# Patient Record
Sex: Female | Born: 1960 | Race: White | Hispanic: No | Marital: Single | Smoking: Never smoker
Health system: Southern US, Community
[De-identification: ages and names within clinical notes are randomized; demographics above are authoritative.]

## PROBLEM LIST (undated history)

## (undated) DIAGNOSIS — M7500 Adhesive capsulitis of unspecified shoulder: Secondary | ICD-10-CM

## (undated) DIAGNOSIS — I10 Essential (primary) hypertension: Secondary | ICD-10-CM

## (undated) DIAGNOSIS — K635 Polyp of colon: Secondary | ICD-10-CM

## (undated) DIAGNOSIS — K56609 Unspecified intestinal obstruction, unspecified as to partial versus complete obstruction: Secondary | ICD-10-CM

## (undated) HISTORY — DX: Polyp of colon: K63.5

## (undated) HISTORY — DX: Essential (primary) hypertension: I10

## (undated) HISTORY — DX: Unspecified intestinal obstruction, unspecified as to partial versus complete obstruction: K56.609

## (undated) HISTORY — DX: Adhesive capsulitis of unspecified shoulder: M75.00

---

## 1987-11-16 HISTORY — PX: TONSILLECTOMY AND ADENOIDECTOMY: SUR1326

## 1999-02-16 ENCOUNTER — Other Ambulatory Visit: Admission: RE | Admit: 1999-02-16 | Discharge: 1999-02-16 | Payer: Self-pay | Admitting: Obstetrics and Gynecology

## 2000-03-18 ENCOUNTER — Other Ambulatory Visit: Admission: RE | Admit: 2000-03-18 | Discharge: 2000-03-18 | Payer: Self-pay | Admitting: Obstetrics and Gynecology

## 2001-03-21 ENCOUNTER — Other Ambulatory Visit: Admission: RE | Admit: 2001-03-21 | Discharge: 2001-03-21 | Payer: Self-pay | Admitting: Obstetrics and Gynecology

## 2002-05-10 ENCOUNTER — Other Ambulatory Visit: Admission: RE | Admit: 2002-05-10 | Discharge: 2002-05-10 | Payer: Self-pay | Admitting: Obstetrics and Gynecology

## 2003-08-08 ENCOUNTER — Other Ambulatory Visit: Admission: RE | Admit: 2003-08-08 | Discharge: 2003-08-08 | Payer: Self-pay | Admitting: Obstetrics and Gynecology

## 2003-11-11 ENCOUNTER — Emergency Department (HOSPITAL_COMMUNITY): Admission: AD | Admit: 2003-11-11 | Discharge: 2003-11-11 | Payer: Self-pay | Admitting: Family Medicine

## 2003-11-20 ENCOUNTER — Emergency Department (HOSPITAL_COMMUNITY): Admission: AD | Admit: 2003-11-20 | Discharge: 2003-11-20 | Payer: Self-pay | Admitting: Family Medicine

## 2005-06-30 ENCOUNTER — Other Ambulatory Visit: Admission: RE | Admit: 2005-06-30 | Discharge: 2005-06-30 | Payer: Self-pay | Admitting: Obstetrics and Gynecology

## 2006-09-07 ENCOUNTER — Other Ambulatory Visit: Admission: RE | Admit: 2006-09-07 | Discharge: 2006-09-07 | Payer: Self-pay | Admitting: Obstetrics and Gynecology

## 2008-11-29 DIAGNOSIS — R1084 Generalized abdominal pain: Secondary | ICD-10-CM | POA: Insufficient documentation

## 2008-12-16 HISTORY — PX: COLECTOMY: SHX59

## 2008-12-26 ENCOUNTER — Emergency Department (HOSPITAL_COMMUNITY): Admission: EM | Admit: 2008-12-26 | Discharge: 2008-12-26 | Payer: Self-pay | Admitting: Family Medicine

## 2008-12-27 ENCOUNTER — Ambulatory Visit: Payer: Self-pay | Admitting: Family Medicine

## 2008-12-27 ENCOUNTER — Encounter: Payer: Self-pay | Admitting: Family Medicine

## 2008-12-27 ENCOUNTER — Inpatient Hospital Stay (HOSPITAL_COMMUNITY): Admission: EM | Admit: 2008-12-27 | Discharge: 2009-01-09 | Payer: Self-pay | Admitting: Emergency Medicine

## 2008-12-27 ENCOUNTER — Other Ambulatory Visit: Admission: RE | Admit: 2008-12-27 | Discharge: 2008-12-27 | Payer: Self-pay | Admitting: Family Medicine

## 2008-12-30 ENCOUNTER — Telehealth: Payer: Self-pay | Admitting: Family Medicine

## 2009-01-01 ENCOUNTER — Encounter (INDEPENDENT_AMBULATORY_CARE_PROVIDER_SITE_OTHER): Payer: Self-pay | Admitting: Surgery

## 2009-01-27 ENCOUNTER — Ambulatory Visit: Payer: Self-pay | Admitting: Family Medicine

## 2009-01-27 DIAGNOSIS — E1140 Type 2 diabetes mellitus with diabetic neuropathy, unspecified: Secondary | ICD-10-CM | POA: Insufficient documentation

## 2009-01-27 LAB — CONVERTED CEMR LAB
Albumin: 4 g/dL (ref 3.5–5.2)
Alkaline Phosphatase: 70 units/L (ref 39–117)
BUN: 19 mg/dL (ref 6–23)
Bilirubin, Direct: 0.1 mg/dL (ref 0.0–0.3)
Blood Glucose, Fingerstick: 159
Blood in Urine, dipstick: NEGATIVE
Calcium: 9.5 mg/dL (ref 8.4–10.5)
Cholesterol: 236 mg/dL (ref 0–200)
Creatinine, Ser: 0.6 mg/dL (ref 0.4–1.2)
Creatinine,U: 172.4 mg/dL
Hemoglobin: 14.3 g/dL (ref 12.0–15.0)
Lymphocytes Relative: 28 % (ref 12.0–46.0)
MCHC: 34.5 g/dL (ref 30.0–36.0)
MCV: 81.9 fL (ref 78.0–100.0)
Microalb, Ur: 0.7 mg/dL (ref 0.0–1.9)
Monocytes Relative: 6.4 % (ref 3.0–12.0)
Neutro Abs: 6.3 10*3/uL (ref 1.4–7.7)
Neutrophils Relative %: 63.4 % (ref 43.0–77.0)
Potassium: 3.7 meq/L (ref 3.5–5.1)
RBC: 5.07 M/uL (ref 3.87–5.11)
Total Bilirubin: 0.6 mg/dL (ref 0.3–1.2)
Total CHOL/HDL Ratio: 5.8
Total Protein: 7.2 g/dL (ref 6.0–8.3)
Triglycerides: 184 mg/dL — ABNORMAL HIGH (ref 0–149)
Urobilinogen, UA: 0.2
VLDL: 37 mg/dL (ref 0–40)

## 2009-09-15 ENCOUNTER — Encounter: Admission: RE | Admit: 2009-09-15 | Discharge: 2009-09-15 | Payer: Self-pay | Admitting: Obstetrics and Gynecology

## 2009-09-30 ENCOUNTER — Inpatient Hospital Stay (HOSPITAL_COMMUNITY): Admission: RE | Admit: 2009-09-30 | Discharge: 2009-10-04 | Payer: Self-pay | Admitting: Surgery

## 2009-09-30 HISTORY — PX: VENTRAL HERNIA REPAIR: SHX424

## 2009-10-10 ENCOUNTER — Inpatient Hospital Stay (HOSPITAL_COMMUNITY): Admission: EM | Admit: 2009-10-10 | Discharge: 2009-10-16 | Payer: Self-pay | Admitting: Emergency Medicine

## 2009-11-20 ENCOUNTER — Encounter: Admission: RE | Admit: 2009-11-20 | Discharge: 2009-11-20 | Payer: Self-pay | Admitting: Gastroenterology

## 2009-12-29 ENCOUNTER — Ambulatory Visit (HOSPITAL_COMMUNITY): Admission: RE | Admit: 2009-12-29 | Discharge: 2009-12-29 | Payer: Self-pay | Admitting: Gastroenterology

## 2010-02-13 IMAGING — CR DG ABDOMEN 2V
3 series · 3 of 3 positions shown · non-contrast
Comparison: 12/26/2008

CLINICAL DATA: Abdominal pain - colonic obstruction.

ABDOMEN - 2 VIEW

[w abdomen upright]
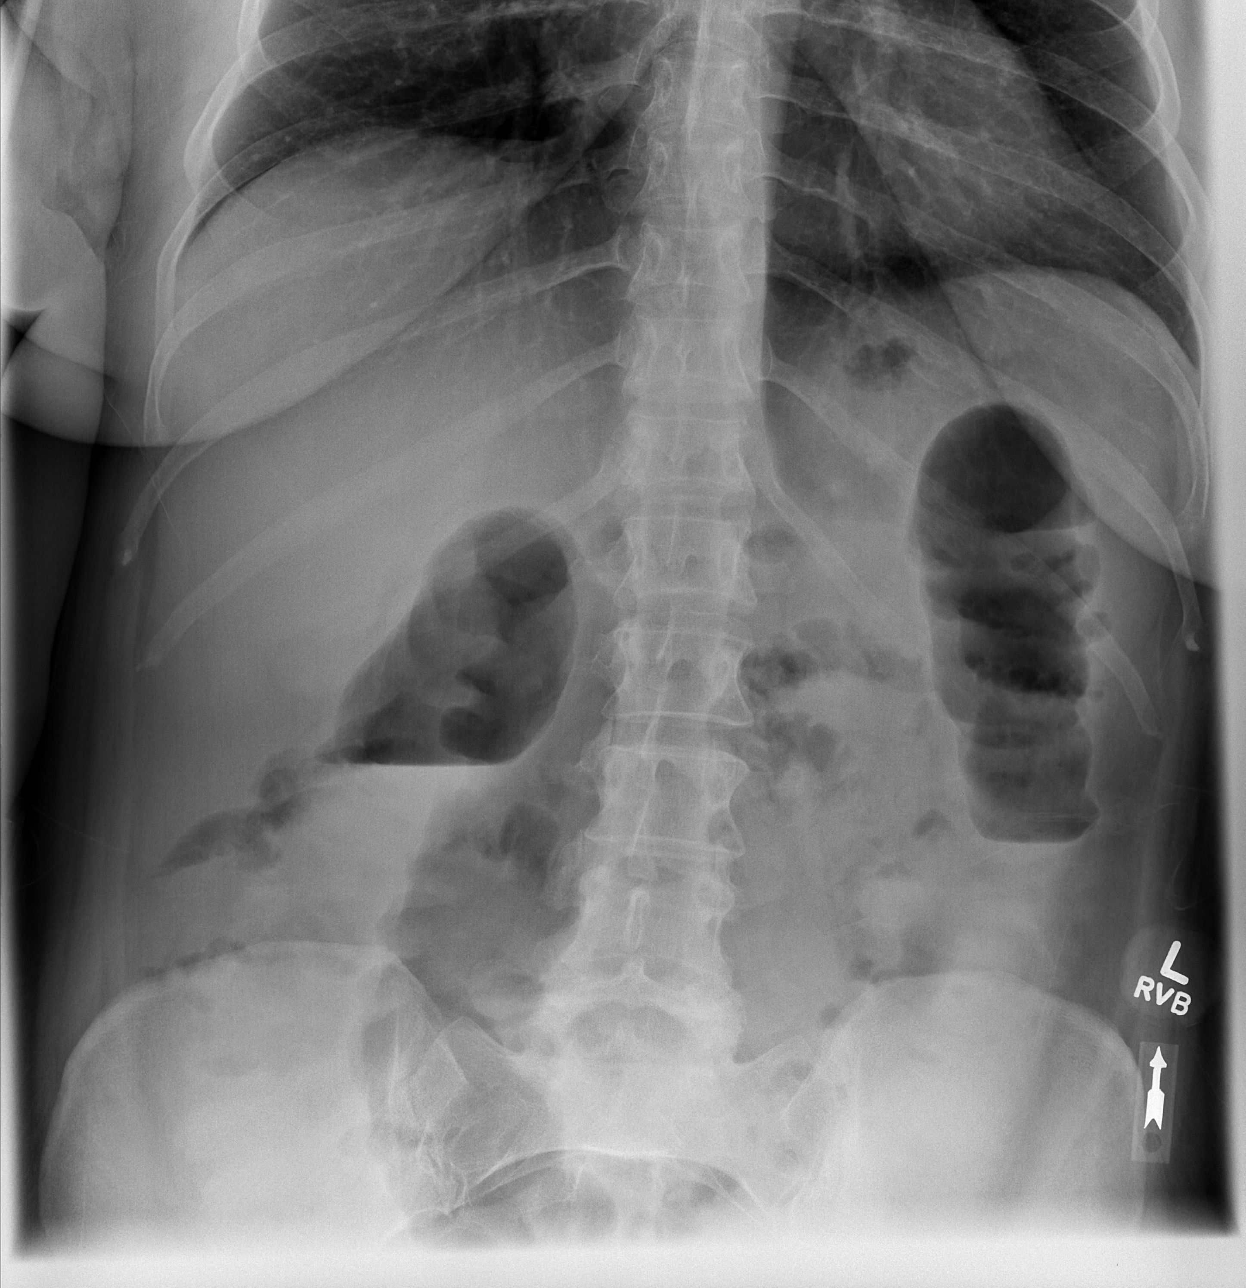

[t abdomen supine (1 of 2)]
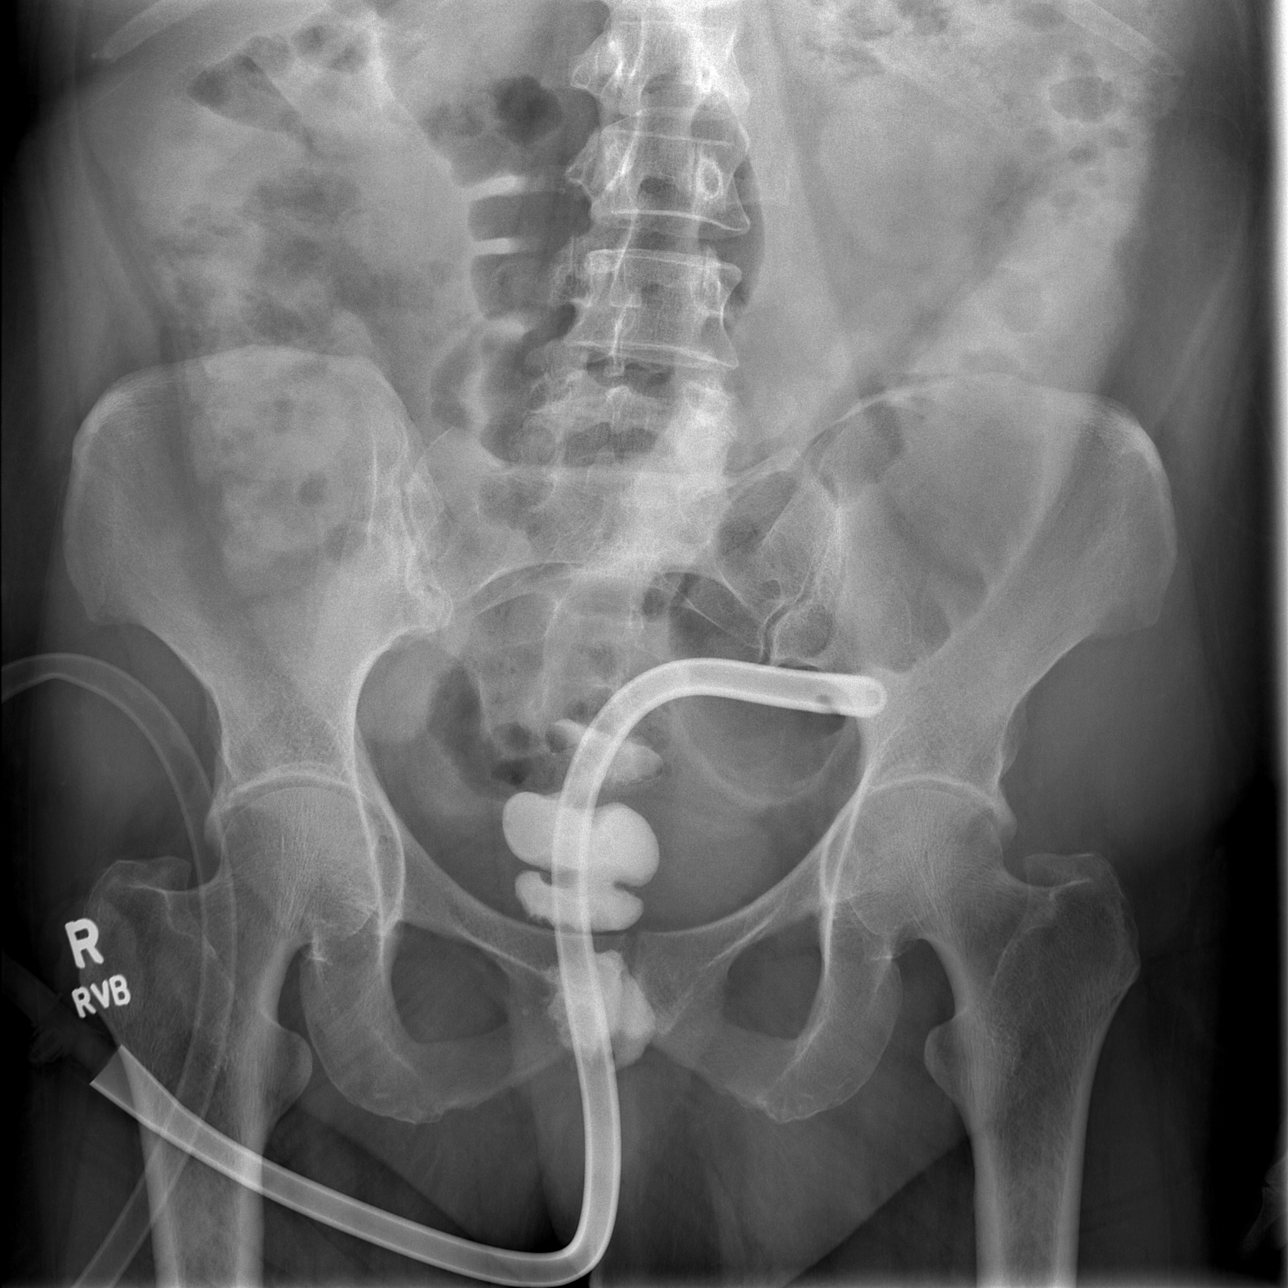

[t abdomen supine (2 of 2)]
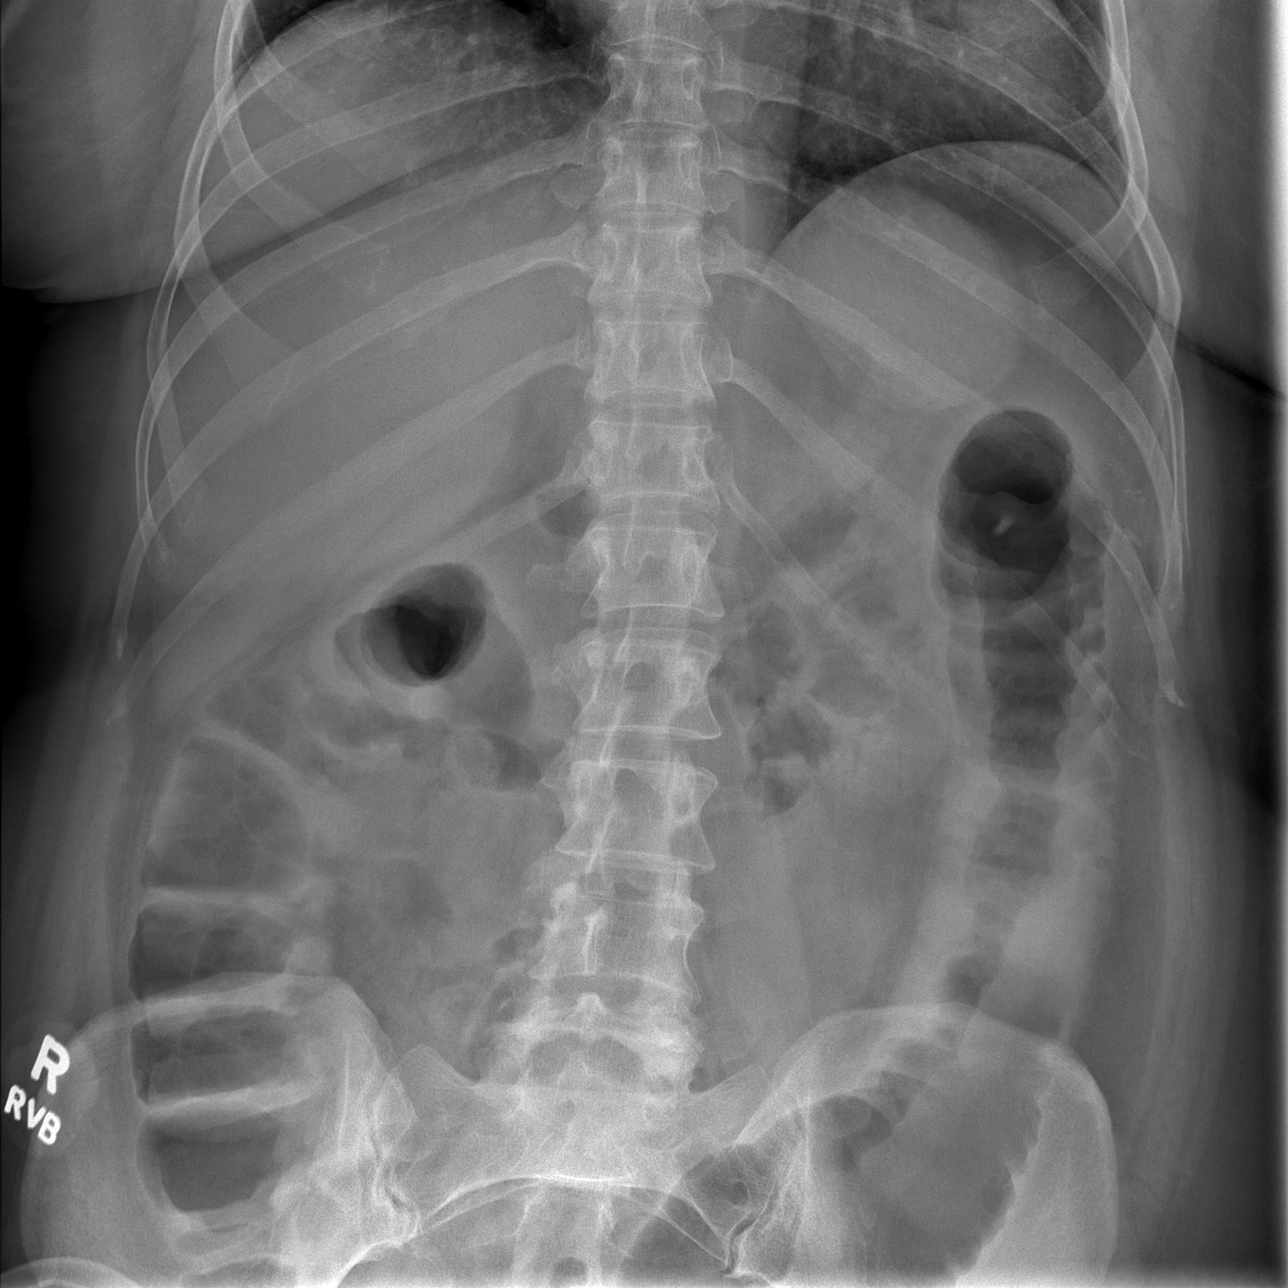

[3 of 3 positions shown; findings below may reference images not displayed]

FINDINGS: Interval placement of a rectal tube with decreased
colonic distention is noted.
Nondistended small bowel loops are present.
There is no evidence of pneumothorax.
No acute bony abnormalities are identified.
IMPRESSION: Interval placement of a rectal tube with decompression of the
colon.

## 2010-02-13 IMAGING — RF DG C-ARM 61-120 MIN
2 series · 2 of 2 positions shown · non-contrast
Comparison: none

CLINICAL DATA: ABDOMINAL PAIN 

C-ARM 1-60 MINUTES 
Fluoroscopy was utilized by the requesting physician.  No radiographic 
interpretation.

[Series 1: cont. · 1 of 1 slices shown (1 of 2)]
[im 1/1]
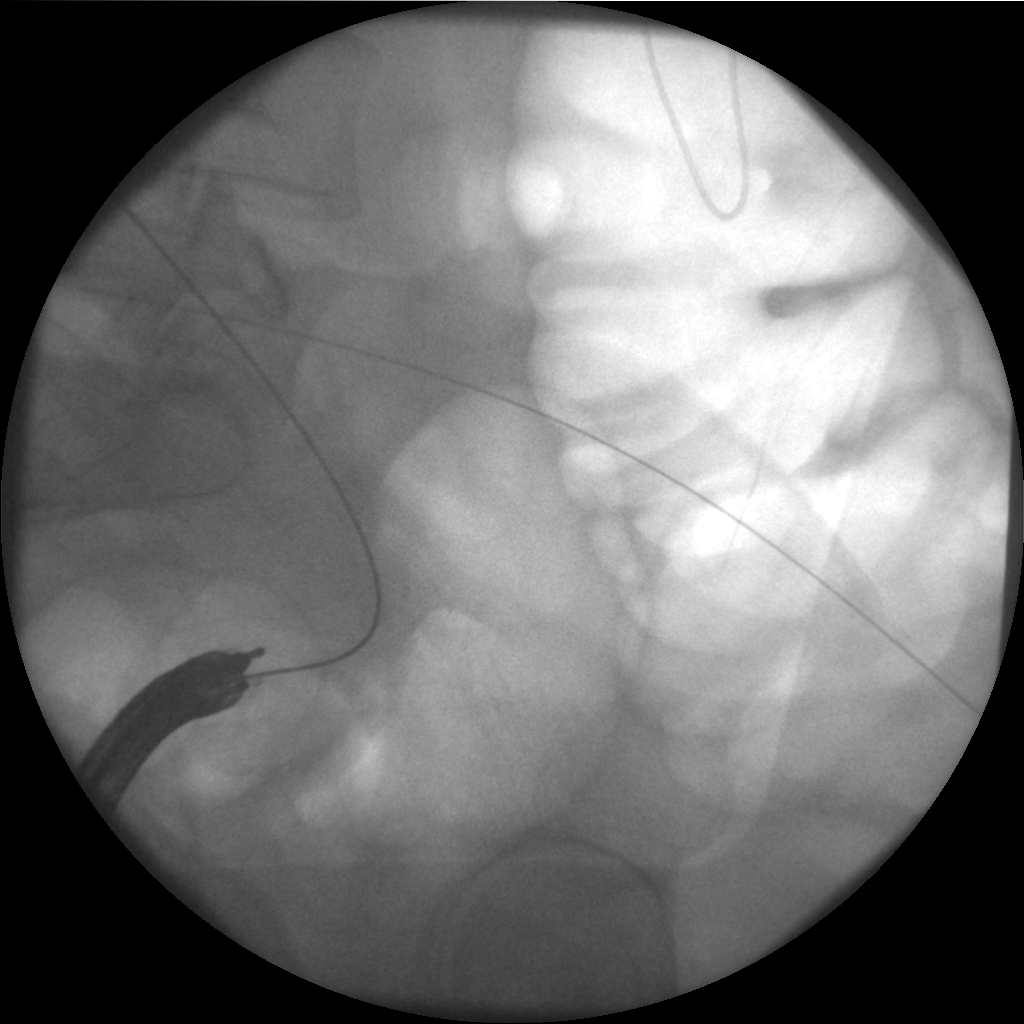

[Series 2: cont. · 1 of 1 slices shown (2 of 2)]
[im 1/1]
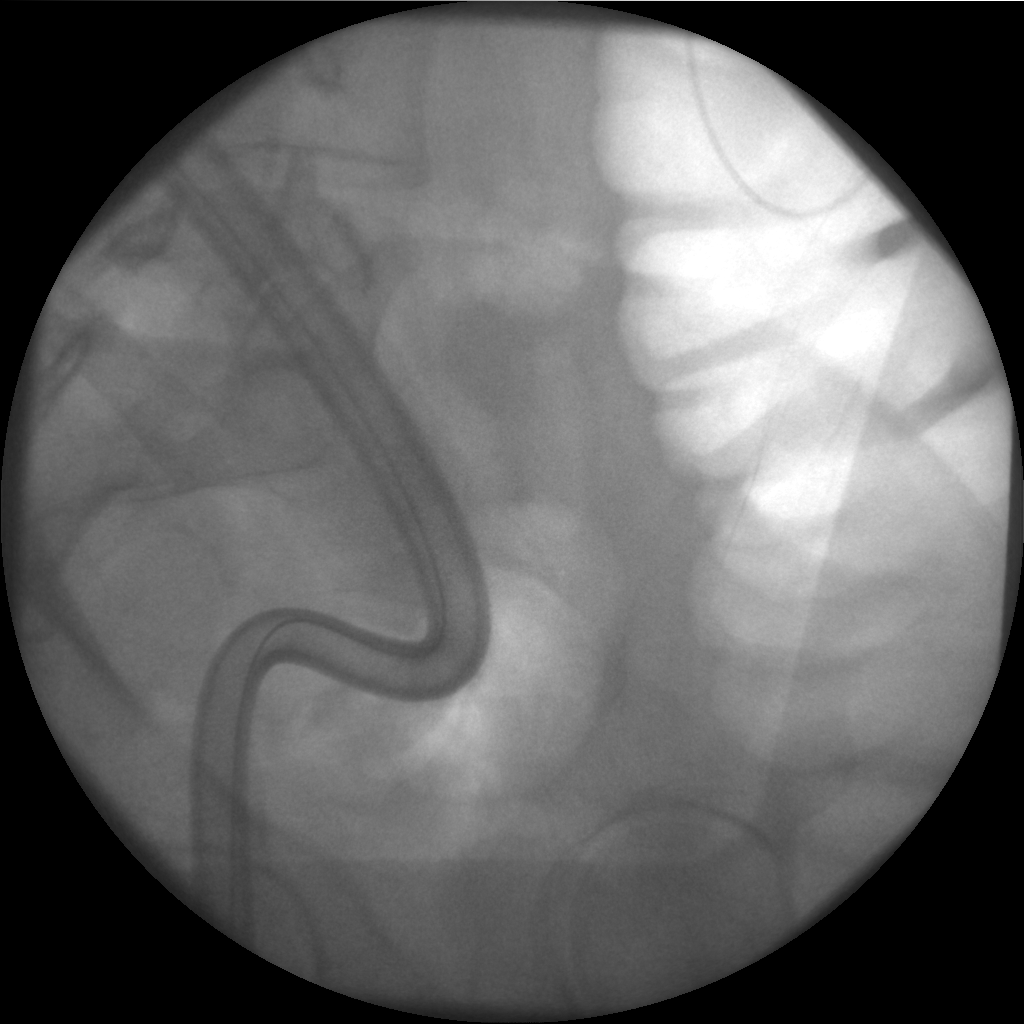

[2 of 2 positions shown; findings below may reference images not displayed]

## 2010-02-14 IMAGING — CR DG CHEST 1V PORT
1 series · 1 of 1 positions shown · non-contrast
Comparison: 12/28/2008

CLINICAL DATA: Abdominal pain.  PICC placement.

PORTABLE CHEST - 1 VIEW

[view not recorded]
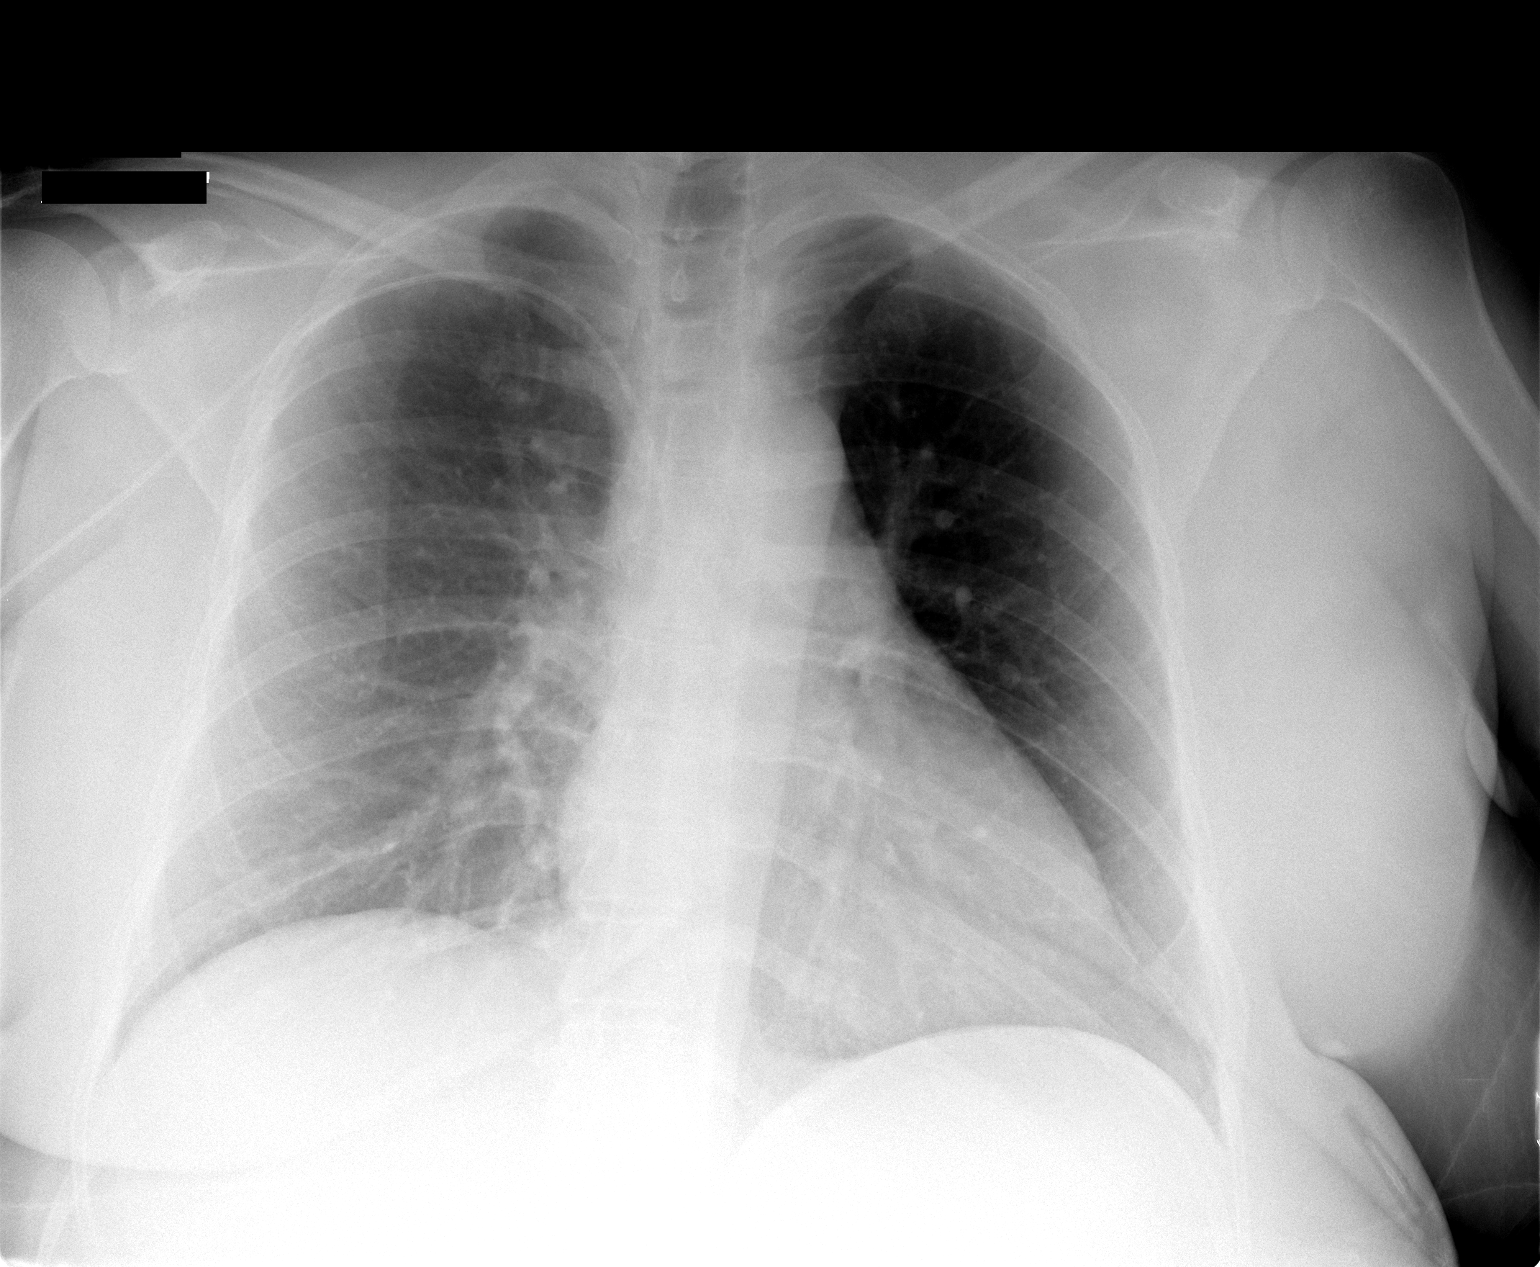

[1 of 1 positions shown; findings below may reference images not displayed]

FINDINGS: Right upper extremity PICC has been placed.  The tip is
difficult to visualize but appears in the upper SVC.
Cardiopericardial silhouette within normal limits.  Atelectasis is
present at both lung bases.  No airspace disease or effusion
identified.
IMPRESSION: Interval placement right upper extremity PICC.  There appears at
least in the superior SVC.

## 2010-11-15 HISTORY — PX: CLAVICLE EXCISION: SHX597

## 2011-02-16 LAB — GLUCOSE, CAPILLARY
Glucose-Capillary: 119 mg/dL — ABNORMAL HIGH (ref 70–99)
Glucose-Capillary: 143 mg/dL — ABNORMAL HIGH (ref 70–99)

## 2011-02-17 LAB — DIFFERENTIAL
Basophils Absolute: 0 10*3/uL (ref 0.0–0.1)
Lymphocytes Relative: 15 % (ref 12–46)
Lymphocytes Relative: 29 % (ref 12–46)
Lymphs Abs: 1.3 10*3/uL (ref 0.7–4.0)
Lymphs Abs: 3.1 10*3/uL (ref 0.7–4.0)
Monocytes Relative: 6 % (ref 3–12)
Neutro Abs: 6.7 10*3/uL (ref 1.7–7.7)
Neutro Abs: 6.8 10*3/uL (ref 1.7–7.7)
Neutrophils Relative %: 80 % — ABNORMAL HIGH (ref 43–77)
Neutrophils Relative %: 64 % (ref 43–77)

## 2011-02-17 LAB — GLUCOSE, CAPILLARY
Glucose-Capillary: 115 mg/dL — ABNORMAL HIGH (ref 70–99)
Glucose-Capillary: 126 mg/dL — ABNORMAL HIGH (ref 70–99)
Glucose-Capillary: 131 mg/dL — ABNORMAL HIGH (ref 70–99)
Glucose-Capillary: 133 mg/dL — ABNORMAL HIGH (ref 70–99)
Glucose-Capillary: 140 mg/dL — ABNORMAL HIGH (ref 70–99)
Glucose-Capillary: 141 mg/dL — ABNORMAL HIGH (ref 70–99)
Glucose-Capillary: 141 mg/dL — ABNORMAL HIGH (ref 70–99)
Glucose-Capillary: 143 mg/dL — ABNORMAL HIGH (ref 70–99)
Glucose-Capillary: 146 mg/dL — ABNORMAL HIGH (ref 70–99)
Glucose-Capillary: 151 mg/dL — ABNORMAL HIGH (ref 70–99)
Glucose-Capillary: 162 mg/dL — ABNORMAL HIGH (ref 70–99)
Glucose-Capillary: 165 mg/dL — ABNORMAL HIGH (ref 70–99)
Glucose-Capillary: 165 mg/dL — ABNORMAL HIGH (ref 70–99)
Glucose-Capillary: 172 mg/dL — ABNORMAL HIGH (ref 70–99)
Glucose-Capillary: 174 mg/dL — ABNORMAL HIGH (ref 70–99)
Glucose-Capillary: 177 mg/dL — ABNORMAL HIGH (ref 70–99)
Glucose-Capillary: 180 mg/dL — ABNORMAL HIGH (ref 70–99)
Glucose-Capillary: 185 mg/dL — ABNORMAL HIGH (ref 70–99)
Glucose-Capillary: 202 mg/dL — ABNORMAL HIGH (ref 70–99)

## 2011-02-17 LAB — COMPREHENSIVE METABOLIC PANEL
ALT: 16 U/L (ref 0–35)
AST: 13 U/L (ref 0–37)
BUN: 22 mg/dL (ref 6–23)
BUN: 15 mg/dL (ref 6–23)
CO2: 29 mEq/L (ref 19–32)
Calcium: 9.5 mg/dL (ref 8.4–10.5)
Calcium: 9 mg/dL (ref 8.4–10.5)
Chloride: 100 mEq/L (ref 96–112)
Creatinine, Ser: 0.84 mg/dL (ref 0.4–1.2)
GFR calc Af Amer: >60 mL/min (ref >60)
GFR calc non Af Amer: >60 mL/min (ref >60)
Glucose, Bld: 161 mg/dL — ABNORMAL HIGH (ref 70–99)
Sodium: 138 mEq/L (ref 135–145)
Total Bilirubin: 0.7 mg/dL (ref 0.3–1.2)
Total Protein: 7.3 g/dL (ref 6.0–8.3)

## 2011-02-17 LAB — CBC
HCT: 35.7 % — ABNORMAL LOW (ref 36.0–46.0)
HCT: 43.6 % (ref 36.0–46.0)
Hemoglobin: 12.1 g/dL (ref 12.0–15.0)
Hemoglobin: 13.3 g/dL (ref 12.0–15.0)
MCHC: 33.1 g/dL (ref 30.0–36.0)
MCHC: 34 g/dL (ref 30.0–36.0)
MCHC: 33.7 g/dL (ref 30.0–36.0)
MCV: 83.5 fL (ref 78.0–100.0)
Platelets: 438 10*3/uL — ABNORMAL HIGH (ref 150–400)
Platelets: 486 10*3/uL — ABNORMAL HIGH (ref 150–400)
RBC: 4.43 MIL/uL (ref 3.87–5.11)
RBC: 5.22 MIL/uL — ABNORMAL HIGH (ref 3.87–5.11)
RDW: 13.4 % (ref 11.5–15.5)
RDW: 13.8 % (ref 11.5–15.5)
WBC: 10.1 10*3/uL (ref 4.0–10.5)
WBC: 8.4 10*3/uL (ref 4.0–10.5)

## 2011-02-17 LAB — URINALYSIS, ROUTINE W REFLEX MICROSCOPIC
Bilirubin Urine: NEGATIVE
Leukocytes, UA: NEGATIVE
Nitrite: NEGATIVE
Specific Gravity, Urine: 1.028 (ref 1.005–1.030)
Urobilinogen, UA: 0.2 mg/dL (ref 0.0–1.0)
pH: 6.5 (ref 5.0–8.0)

## 2011-02-17 LAB — BASIC METABOLIC PANEL
BUN: 9 mg/dL (ref 6–23)
CO2: 24 mEq/L (ref 19–32)
Calcium: 8.3 mg/dL — ABNORMAL LOW (ref 8.4–10.5)
Creatinine, Ser: 0.64 mg/dL (ref 0.4–1.2)
GFR calc Af Amer: >60 mL/min (ref >60)
GFR calc Af Amer: >60 mL/min (ref >60)
GFR calc non Af Amer: >60 mL/min (ref >60)
GFR calc non Af Amer: >60 mL/min (ref >60)
Potassium: 4.2 mEq/L (ref 3.5–5.1)
Sodium: 139 mEq/L (ref 135–145)

## 2011-02-17 LAB — URINE MICROSCOPIC-ADD ON

## 2011-02-17 LAB — LIPASE, BLOOD: Lipase: 14 U/L (ref 11–59)

## 2011-03-02 LAB — TSH: TSH: 0.732 u[IU]/mL (ref 0.350–4.500)

## 2011-03-02 LAB — TRIGLYCERIDES
Triglycerides: 180 mg/dL — ABNORMAL HIGH (ref <150)
Triglycerides: 181 mg/dL — ABNORMAL HIGH (ref <150)

## 2011-03-02 LAB — GLUCOSE, CAPILLARY
Glucose-Capillary: 149 mg/dL — ABNORMAL HIGH (ref 70–99)
Glucose-Capillary: 151 mg/dL — ABNORMAL HIGH (ref 70–99)
Glucose-Capillary: 212 mg/dL — ABNORMAL HIGH (ref 70–99)
Glucose-Capillary: 218 mg/dL — ABNORMAL HIGH (ref 70–99)
Glucose-Capillary: 221 mg/dL — ABNORMAL HIGH (ref 70–99)
Glucose-Capillary: 112 mg/dL — ABNORMAL HIGH (ref 70–99)
Glucose-Capillary: 117 mg/dL — ABNORMAL HIGH (ref 70–99)
Glucose-Capillary: 118 mg/dL — ABNORMAL HIGH (ref 70–99)
Glucose-Capillary: 131 mg/dL — ABNORMAL HIGH (ref 70–99)
Glucose-Capillary: 132 mg/dL — ABNORMAL HIGH (ref 70–99)
Glucose-Capillary: 134 mg/dL — ABNORMAL HIGH (ref 70–99)
Glucose-Capillary: 143 mg/dL — ABNORMAL HIGH (ref 70–99)
Glucose-Capillary: 145 mg/dL — ABNORMAL HIGH (ref 70–99)
Glucose-Capillary: 147 mg/dL — ABNORMAL HIGH (ref 70–99)
Glucose-Capillary: 149 mg/dL — ABNORMAL HIGH (ref 70–99)
Glucose-Capillary: 155 mg/dL — ABNORMAL HIGH (ref 70–99)
Glucose-Capillary: 158 mg/dL — ABNORMAL HIGH (ref 70–99)
Glucose-Capillary: 160 mg/dL — ABNORMAL HIGH (ref 70–99)
Glucose-Capillary: 162 mg/dL — ABNORMAL HIGH (ref 70–99)
Glucose-Capillary: 169 mg/dL — ABNORMAL HIGH (ref 70–99)
Glucose-Capillary: 170 mg/dL — ABNORMAL HIGH (ref 70–99)
Glucose-Capillary: 172 mg/dL — ABNORMAL HIGH (ref 70–99)
Glucose-Capillary: 175 mg/dL — ABNORMAL HIGH (ref 70–99)
Glucose-Capillary: 183 mg/dL — ABNORMAL HIGH (ref 70–99)
Glucose-Capillary: 185 mg/dL — ABNORMAL HIGH (ref 70–99)
Glucose-Capillary: 189 mg/dL — ABNORMAL HIGH (ref 70–99)
Glucose-Capillary: 190 mg/dL — ABNORMAL HIGH (ref 70–99)
Glucose-Capillary: 204 mg/dL — ABNORMAL HIGH (ref 70–99)
Glucose-Capillary: 204 mg/dL — ABNORMAL HIGH (ref 70–99)
Glucose-Capillary: 213 mg/dL — ABNORMAL HIGH (ref 70–99)
Glucose-Capillary: 217 mg/dL — ABNORMAL HIGH (ref 70–99)
Glucose-Capillary: 226 mg/dL — ABNORMAL HIGH (ref 70–99)
Glucose-Capillary: 240 mg/dL — ABNORMAL HIGH (ref 70–99)
Glucose-Capillary: 240 mg/dL — ABNORMAL HIGH (ref 70–99)
Glucose-Capillary: 253 mg/dL — ABNORMAL HIGH (ref 70–99)
Glucose-Capillary: 261 mg/dL — ABNORMAL HIGH (ref 70–99)

## 2011-03-02 LAB — HEMOGLOBIN A1C
Hgb A1c MFr Bld: 10.9 % — ABNORMAL HIGH (ref 4.6–6.1)
Mean Plasma Glucose: 266 mg/dL

## 2011-03-02 LAB — BASIC METABOLIC PANEL
BUN: 2 mg/dL — ABNORMAL LOW (ref 6–23)
BUN: 1 mg/dL — ABNORMAL LOW (ref 6–23)
BUN: 10 mg/dL (ref 6–23)
BUN: 9 mg/dL (ref 6–23)
CO2: 24 mEq/L (ref 19–32)
CO2: 24 mEq/L (ref 19–32)
CO2: 29 mEq/L (ref 19–32)
CO2: 31 mEq/L (ref 19–32)
Calcium: 8.2 mg/dL — ABNORMAL LOW (ref 8.4–10.5)
Calcium: 8.3 mg/dL — ABNORMAL LOW (ref 8.4–10.5)
Calcium: 8.4 mg/dL (ref 8.4–10.5)
Calcium: 7.9 mg/dL — ABNORMAL LOW (ref 8.4–10.5)
Calcium: 8.5 mg/dL (ref 8.4–10.5)
Chloride: 98 mEq/L (ref 96–112)
Chloride: 99 mEq/L (ref 96–112)
Creatinine, Ser: 0.5 mg/dL (ref 0.4–1.2)
Creatinine, Ser: 0.61 mg/dL (ref 0.4–1.2)
Creatinine, Ser: 0.64 mg/dL (ref 0.4–1.2)
Creatinine, Ser: 0.51 mg/dL (ref 0.4–1.2)
GFR calc Af Amer: >60 mL/min (ref >60)
GFR calc Af Amer: >60 mL/min (ref >60)
GFR calc Af Amer: >60 mL/min (ref >60)
GFR calc non Af Amer: >60 mL/min (ref >60)
GFR calc non Af Amer: >60 mL/min (ref >60)
GFR calc non Af Amer: >60 mL/min (ref >60)
GFR calc non Af Amer: >60 mL/min (ref >60)
GFR calc non Af Amer: >60 mL/min (ref >60)
Glucose, Bld: 164 mg/dL — ABNORMAL HIGH (ref 70–99)
Glucose, Bld: 168 mg/dL — ABNORMAL HIGH (ref 70–99)
Glucose, Bld: 169 mg/dL — ABNORMAL HIGH (ref 70–99)
Potassium: 2.8 mEq/L — ABNORMAL LOW (ref 3.5–5.1)
Potassium: 4.1 mEq/L (ref 3.5–5.1)
Potassium: 4.1 mEq/L (ref 3.5–5.1)
Sodium: 133 mEq/L — ABNORMAL LOW (ref 135–145)
Sodium: 135 mEq/L (ref 135–145)
Sodium: 132 mEq/L — ABNORMAL LOW (ref 135–145)
Sodium: 133 mEq/L — ABNORMAL LOW (ref 135–145)
Sodium: 136 mEq/L (ref 135–145)

## 2011-03-02 LAB — DIFFERENTIAL
Basophils Absolute: 0.3 10*3/uL — ABNORMAL HIGH (ref 0.0–0.1)
Basophils Absolute: 0 10*3/uL (ref 0.0–0.1)
Basophils Relative: 2 % — ABNORMAL HIGH (ref 0–1)
Basophils Relative: 0 % (ref 0–1)
Basophils Relative: 0 % (ref 0–1)
Basophils Relative: 1 % (ref 0–1)
Eosinophils Absolute: 0.2 10*3/uL (ref 0.0–0.7)
Eosinophils Absolute: 0.3 10*3/uL (ref 0.0–0.7)
Eosinophils Relative: 5 % (ref 0–5)
Lymphs Abs: 2 10*3/uL (ref 0.7–4.0)
Lymphs Abs: 2.4 10*3/uL (ref 0.7–4.0)
Monocytes Absolute: 0.6 10*3/uL (ref 0.1–1.0)
Monocytes Relative: 8 % (ref 3–12)
Monocytes Relative: 9 % (ref 3–12)
Neutro Abs: 10.4 10*3/uL — ABNORMAL HIGH (ref 1.7–7.7)
Neutro Abs: 10.6 10*3/uL — ABNORMAL HIGH (ref 1.7–7.7)
Neutro Abs: 5.2 10*3/uL (ref 1.7–7.7)
Neutrophils Relative %: 69 % (ref 43–77)
Neutrophils Relative %: 58 % (ref 43–77)
Neutrophils Relative %: 79 % — ABNORMAL HIGH (ref 43–77)

## 2011-03-02 LAB — CBC
HCT: 45.5 % (ref 36.0–46.0)
HCT: 49 % — ABNORMAL HIGH (ref 36.0–46.0)
Hemoglobin: 15.6 g/dL — ABNORMAL HIGH (ref 12.0–15.0)
Hemoglobin: 16.4 g/dL — ABNORMAL HIGH (ref 12.0–15.0)
Hemoglobin: 10.6 g/dL — ABNORMAL LOW (ref 12.0–15.0)
MCHC: 34.3 g/dL (ref 30.0–36.0)
MCHC: 33.3 g/dL (ref 30.0–36.0)
MCHC: 33.9 g/dL (ref 30.0–36.0)
MCV: 79.8 fL (ref 78.0–100.0)
MCV: 80.6 fL (ref 78.0–100.0)
Platelets: 302 10*3/uL (ref 150–400)
Platelets: 248 10*3/uL (ref 150–400)
RBC: 5.65 MIL/uL — ABNORMAL HIGH (ref 3.87–5.11)
RBC: 6.17 MIL/uL — ABNORMAL HIGH (ref 3.87–5.11)
RBC: 3.92 MIL/uL (ref 3.87–5.11)
RDW: 13 % (ref 11.5–15.5)
RDW: 13.8 % (ref 11.5–15.5)
RDW: 13.9 % (ref 11.5–15.5)
RDW: 14 % (ref 11.5–15.5)
WBC: 10.1 10*3/uL (ref 4.0–10.5)

## 2011-03-02 LAB — COMPREHENSIVE METABOLIC PANEL
ALT: 41 U/L — ABNORMAL HIGH (ref 0–35)
ALT: 22 U/L (ref 0–35)
ALT: 35 U/L (ref 0–35)
ALT: 37 U/L — ABNORMAL HIGH (ref 0–35)
AST: 21 U/L (ref 0–37)
Albumin: 2.9 g/dL — ABNORMAL LOW (ref 3.5–5.2)
Alkaline Phosphatase: 110 U/L (ref 39–117)
Alkaline Phosphatase: 73 U/L (ref 39–117)
Alkaline Phosphatase: 90 U/L (ref 39–117)
BUN: 16 mg/dL (ref 6–23)
BUN: 4 mg/dL — ABNORMAL LOW (ref 6–23)
BUN: 8 mg/dL (ref 6–23)
CO2: 25 mEq/L (ref 19–32)
CO2: 27 mEq/L (ref 19–32)
CO2: 27 mEq/L (ref 19–32)
CO2: 30 mEq/L (ref 19–32)
Calcium: 9.3 mg/dL (ref 8.4–10.5)
Calcium: 8 mg/dL — ABNORMAL LOW (ref 8.4–10.5)
Calcium: 8.3 mg/dL — ABNORMAL LOW (ref 8.4–10.5)
Calcium: 8.4 mg/dL (ref 8.4–10.5)
Chloride: 97 mEq/L (ref 96–112)
Creatinine, Ser: 0.72 mg/dL (ref 0.4–1.2)
Creatinine, Ser: 0.53 mg/dL (ref 0.4–1.2)
GFR calc Af Amer: >60 mL/min (ref >60)
GFR calc non Af Amer: >60 mL/min (ref >60)
GFR calc non Af Amer: >60 mL/min (ref >60)
Glucose, Bld: 234 mg/dL — ABNORMAL HIGH (ref 70–99)
Glucose, Bld: 285 mg/dL — ABNORMAL HIGH (ref 70–99)
Glucose, Bld: 214 mg/dL — ABNORMAL HIGH (ref 70–99)
Glucose, Bld: 244 mg/dL — ABNORMAL HIGH (ref 70–99)
Potassium: 2.6 mEq/L — CL (ref 3.5–5.1)
Potassium: 3.5 mEq/L (ref 3.5–5.1)
Potassium: 4 mEq/L (ref 3.5–5.1)
Sodium: 134 mEq/L — ABNORMAL LOW (ref 135–145)
Sodium: 135 mEq/L (ref 135–145)
Sodium: 137 mEq/L (ref 135–145)
Total Bilirubin: 0.9 mg/dL (ref 0.3–1.2)
Total Bilirubin: 1.1 mg/dL (ref 0.3–1.2)
Total Protein: 7.6 g/dL (ref 6.0–8.3)
Total Protein: 5.1 g/dL — ABNORMAL LOW (ref 6.0–8.3)
Total Protein: 5.1 g/dL — ABNORMAL LOW (ref 6.0–8.3)
Total Protein: 5.6 g/dL — ABNORMAL LOW (ref 6.0–8.3)

## 2011-03-02 LAB — PROTIME-INR
INR: 1 (ref 0.00–1.49)
Prothrombin Time: 13.5 seconds (ref 11.6–15.2)

## 2011-03-02 LAB — CA 125: CA 125: 11.8 U/mL (ref 0.0–30.2)

## 2011-03-02 LAB — MAGNESIUM: Magnesium: 1.5 mg/dL (ref 1.5–2.5)

## 2011-03-02 LAB — CHOLESTEROL, TOTAL: Cholesterol: 159 mg/dL (ref 0–200)

## 2011-03-02 LAB — URINALYSIS, ROUTINE W REFLEX MICROSCOPIC
Nitrite: NEGATIVE
Protein, ur: 30 mg/dL — AB
Specific Gravity, Urine: 1.025 (ref 1.005–1.030)
Urobilinogen, UA: 1 mg/dL (ref 0.0–1.0)

## 2011-03-02 LAB — PTH-RELATED PEPTIDE: PTH-related peptide: <0.2 (ref <1.4)

## 2011-03-02 LAB — LIPASE, BLOOD
Lipase: 20 U/L (ref 11–59)
Lipase: 23 U/L (ref 11–59)

## 2011-03-02 LAB — PREALBUMIN
Prealbumin: 12.1 mg/dL — ABNORMAL LOW (ref 18.0–45.0)
Prealbumin: 9.8 mg/dL — ABNORMAL LOW (ref 18.0–45.0)

## 2011-03-02 LAB — AMYLASE: Amylase: 23 U/L — ABNORMAL LOW (ref 27–131)

## 2011-03-02 LAB — PHOSPHORUS: Phosphorus: 3.7 mg/dL (ref 2.3–4.6)

## 2011-03-02 LAB — URINE MICROSCOPIC-ADD ON

## 2011-03-02 LAB — PROLACTIN: Prolactin: 11.1 ng/mL

## 2011-03-18 LAB — HM MAMMOGRAPHY

## 2011-03-23 ENCOUNTER — Encounter: Payer: Self-pay | Admitting: Family Medicine

## 2011-03-30 NOTE — Consult Note (Signed)
NAME:  Breanna Hall, Breanna Hall NO.:  [id number]   MEDICAL RECORD NO.:  [id number]          PATIENT TYPE:  INP   LOCATION:  1536                         FACILITY:  [facility]   PHYSICIAN:  Jordan Hawks. Elnoria Howard, MD    DATE OF BIRTH:  [birth year]   DATE OF CONSULTATION:  12/28/2008  DATE OF DISCHARGE:                                 CONSULTATION   REASON FOR CONSULTATION:  Colonic obstruction secondary to a sigmoid  mass.   PRIMARY CARE Lakisa Lotz:  Eugenio Hoes. Tawanna Cooler, MD   HISTORY OF PRESENT ILLNESS:  This is a 50 year old female without any  significant past medical history who presented with persistent abdominal  pain and distention.  The patient reported on January 16 after eating  she subsequently had projectile vomiting when she went into the parking  lot.  She did not think too much about the symptoms and over the  intervening week she noted a lot of grumbling type of sounds in her  abdomen.  However, she was able to have what she considered normal bowel  movements which were solid and she did also pass flatus.  Despite the  ability of passing stools and flatus, she did have persistent abdominal  distention.  Because of the persistence of the symptoms and abdominal  discomfort, her husband recommended that she have further evaluation at  am Urgent Care.  She subsequently went to [facility] urgent care  facility and a KUB revealed that there was some colonic dilation and she  was admitted to the hospital for further evaluation and treatment.  The  patient does have a family history that is significant for colon cancer  on both sides of her family.  She has a maternal grandmother who had  colon cancer.  However, that was at the age of 106.  Her mother had  breast cancer and also history of a large polyp requiring partial  colectomy.  The patient's paternal side of the family has significant  amount of GYN cancers and she believes there may have been uterine  cancer that was  involved.   PAST MEDICAL AND SURGICAL HISTORY:  As the above.   FAMILY HISTORY:  Is as stated above in history present illness.   SOCIAL HISTORY:  The patient is married.  No alcohol, tobacco or illicit  drug use.   REVIEW OF SYSTEMS:  As stated above in history of present illness.  Otherwise negative.   MEDICATIONS:  1. Sliding scale insulin.  2. Morphine sulfate 2- 4 mg IV every 2 hours p.r.n.  3. Potassium chloride.  4. Benadryl 50 mg.   ALLERGY:  TO PENICILLIN.   PHYSICAL EXAMINATION:  VITAL SIGNS:  Blood pressure is 111/63, heart  rate is 81, respirations 18, temperature is 98.3.  GENERALLY:  The patient is in no acute distress, alert and oriented.  HEENT:  Normocephalic, atraumatic.  Extraocular muscles intact.  NECK:  Is supple.  No lymphadenopathy.  LUNGS:  Clear to auscultation bilaterally.  CARDIOVASCULAR:  Regular rate and rhythm.  ABDOMEN:  Is distended, soft, tympanic.  Positive bowel sounds.  EXTREMITIES:  No clubbing, cyanosis or edema.   LABORATORY VALUES:  White blood cell count 15.0, hemoglobin 16.4, MCV is  79.5, platelets at 396.  PT is 13.5, INR 1.20, PTT is 26.  Sodium is  133, potassium 2.7, chloride is 100, CO2 is 24, glucose 255, BUN 11,  creatinine 0.6.  AST is at 46, ALT is 41, albumin 4.1.   IMPRESSION:  1. Sigmoid colonic mass with resultant colonic obstruction.  2. Family history of colon cancer.  3. Diabetes.   The patient was concerned about undergoing surgery with a resultant  colostomy.  She had requested that further evaluation could be performed  endoscopically.  I have evaluated the CT scan and also discussed the  procedure with the patient. It is not unreasonable to perform a flexible  sigmoidoscopy if I can possibly place in a colonic stent which would  negate the necessity for a colostomy.  I am uncertain if I can pass the  endoscope through.  However, I will perform the procedure under  fluoroscopy.  There is a significant risk  of perforation and I did  discuss this in detail with the patient and her husband.  If I can pass  the endoscope through, then I will proceed with placing a colonic stent  provided that the measurements are correct for the sizing of the stent.   PLAN:  At this time is flexible sigmoidoscopy as described above and  further recommendations pending the findings.      Jordan Hawks Elnoria Howard, MD  Electronically Signed     PDH/MEDQ  D:  12/28/2008  T:  12/28/2008  Job:  [id number]   cc:   Tinnie Gens A. Tawanna Cooler, MD  [address] [place]  [place] [address]   Angelia Mould. Derrell Lolling, M.D.  [address], [address]  [place]  [place] [address]   [facility]

## 2011-03-30 NOTE — Discharge Summary (Signed)
NAME:  Breanna Hall, Breanna Hall NO.:  [id number]   MEDICAL RECORD NO.:  [id number]          PATIENT TYPE:  INP   LOCATION:  1536                         FACILITY:  [facility]   PHYSICIAN:  Rosalyn Gess. Norins, MD  DATE OF BIRTH:  01-Jul-1961   DATE OF ADMISSION:  12/27/2008  DATE OF DISCHARGE:  01/09/2009                               DISCHARGE SUMMARY   ADMITTING DIAGNOSIS:  Abdominal pain with bloating with question of  malignant colon lesion.   DISCHARGE DIAGNOSES:  1. 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  2. New onset diabetes.   CONSULTANTS:  1. Dr. Elnoria Howard for gastroenterology.  2. Dr. Ovidio Kin for general surgery.   PROCEDURES:  1. Abdominal x-ray, December 26, 2008, which showed no obstruction,      cannot exclude edema of the colon, consider CT.  2. CT of the abdomen with contrast which showed distended      predominantly fluid-filled colon compatible with distal      obstruction.  3. CT of the pelvis which shows colon remains distended and fluid      filled into the sigmoid colon whether a soft tissue fullness below      which rectum is normal caliber concerning for tumor within the      sigmoid colon which obstructs the colon.  4. Fluoroscopy.  5. Chest x-ray, December 28, 2008, which showed no active disease.  6. Abdominal x-ray, December 29, 2008, which showed interval placement      of rectal tube with decreased colonic distention.  Nondistended      bowel loops are present.  No evidence of pneumothorax.  7. Chest x-ray, December 30, 2008, showed interval placement of a      right upper extremity PICC line.  8. Plain films of the pelvis, December 31, 2008, showed rectal      catheter placement.  9. Colonoscopy performed December 28, 2008, which was flexible      sigmoidoscopy with decompression of possible volvulus which showed      kinking of the rectosigmoid region with resultant obstruction.  10.Flexible  sigmoidoscopy, December 29, 2008, with colonic      obstruction, placement of rectal tube.  11.Colonoscopy with decompression of the volvulus with colonic      obstruction at 20 cm.  12.Laparoscopic converted to open sigmoid colectomy with end-to-end      anastomosis using a 33 [employer] EEA stapler.   HISTORY OF THE PRESENT ILLNESS:  Patient is a 50 year old woman who  presented to see Dr. Tawanna Cooler for abdominal discomfort and bloating of 4  weeks' duration.  This was a progressive problem.  She got to the point  where because of nausea she was only able to take clear liquids.  She  was seen at [facility] on December 26, 2008, at which time she had  a white count of 15,000 and KUB which showed no free air but mucosal  thickening of the colon.   Patient was seen in the office on the day of admission.  She had diffuse  abdominal tenderness, a distended abdomen, but no rebound.  Pelvic exam  was performed and was normal.  Rectal exam was normal.  Stools guaiac  negative.  She was referred to the emergency department because of her  discomfort and was found to have hypokalemia.  CT scan showed  obstruction in her descending colon with a high level of concern for  possible malignancy.  She was subsequently seen by Dr. Ovidio Kin on  the night of admission in the emergency department.  She was also seen  for a second opinion by Dr. Claud Kelp.  The course of treatment was  to try to decompress the colon with colonoscopy, as well as doing a full  bowel prep so that if surgery were required she would not require  colostomy.   Please see Dr. Nelida Meuse dictation for past medical history, family  history, and admission exam.  Also, see his EMR office note dictation.   HOSPITAL COURSE:  Patient was admitted to the regular floor.  1. GI:  Patient with a complex workup which did reveal she had an      obstructive lesion in the sigmoid colon.  She had multiple      colonoscopy/flexible  sigmoidoscopy as noted which showed persistent      obstruction.  Patient did come to surgery.  Laparoscopic converted      to open laparotomy for sigmoid colon resection with end-to-end      anastomosis.  Surgery was uncomplicated.  Patient did have some      mild wound dehiscence requiring wound packing.  Patient did do      well.  She was afebrile.  She had return of normal bowel motility,      as well as good bowel movements.  Her diet was advanced.  She was      felt at this point to be stable for discharge to home with ongoing      wound care to be performed by the patient's family with followup by      Dr. Ezzard Standing in the office.  Path report returned revealing      endometriosis invasion of the colon causing scarring and stricture.      There was no evidence of malignancy.  2. New onset diabetes.  Patient was found to have elevated serum      glucose.  A1c was evaluated returning at 10.9%.  During her      hospital stay, the patient was on sliding scale and low-dose Lantus      insulin.  She has made a good recovery.  Blood sugars have been      well-controlled.  It is felt that she would be a candidate for      lifestyle management, as well as oral medication alone.  This was      discussed in detail with the patient.  I have recommended the use      of metformin starting at 500 mg daily for several days and      advancing to 500 mg b.i.d.  She will follow a no-sugar, low-      carbohydrate diet.  Discussed with the patient a weight management      plan that would involve diet modification as noted along with      control of portion size.  Target is to reduce her weight to 155 to      160 pounds with a goal of 1 pound per month  weight loss.  Patient      will be followed closely by Dr. Alonza Smoker.  Also, Dr. Tawanna Cooler will      arrange with patient to have outpatient diabetic teaching consult.      Did discuss with the patient the value of capillary blood glucose      monitoring which in  a diet-managed, oral medication-managed patient      is of little value.  At this point, she does not have an interest      in having a Glucometer for home monitoring.  Patient does      demonstrate a good understanding of diabetes and its management.  3. Hypertension.  Patient's blood pressure has been stable throughout      her hospital stay.   DISCHARGE EXAMINATION:  Patient is up in a chair, dressed, ready to be  discharged.  She reports she has had a good bowel movement.  No physical  exam was performed.  Vital signs were stable with a temperature of 98,  blood pressure 108/69, heart rate was 82, respirations were 18, O2  saturation was 97% on room air.  CBG was 128, prior to that 125, prior  to that 161, prior to that 143.   FINAL LABORATORY:  Prealbumin was 9.8 being slightly low.  January 06, 2009, triglycerides were 180, total cholesterol was 159, phosphorus was  slightly elevated at 5.0, magnesium was normal at 2.0.  Comprehensive  metabolic panel was remarkable for glucose of 214, creatinine of 0.54.  CBC with a white count of 7100, hemoglobin 10.6 g, a platelet count was  274,000.  Differential with 58% segs, 28% lymphs, 9% monocytes, 5%  eosinophils.  Patient had vasoactive intestinal peptide at 37 which was  normal.  Blood glucagon level was 63, normal.  PTH-related peptide was  less than 0.2 which is normal.  TSH was 0.732, normal.  Prolactin was  11.1 and normal.  CA-125 was normal at 11.8.  hemoglobin A1c from  December 28, 2008, was 10.9% as noted.   DISCHARGE MEDICATIONS:  1. Metformin 500 mg daily x5 days then 500 mg b.i.d.  2. Hydrocodone APAP for pain as needed.   DISPOSITION:  Patient will see Dr. Ovidio Kin as instructed.  She will  be seen by Dr. Alonza Smoker in 7 to 10 days for followup.  She is to call  Dr. Nelida Meuse office earlier for any problems that arise.   PATIENT'S CONDITION AT TIME OF THIS DISCHARGE DICTATION:  Stable and  improved.       Rosalyn Gess Norins, MD  Electronically Signed     MEN/MEDQ  D:  01/09/2009  T:  01/09/2009  Job:  [id number]   cc:   Sandria Bales. Ezzard Standing, M.D.  [address], [address]  [place]  [place] [address]   Eugenio Hoes. Tawanna Cooler, MD  [address] [place]  [place] [address]

## 2011-03-30 NOTE — Consult Note (Signed)
NAME:  KEIRAN, SIAS NO.:  [id number]   MEDICAL RECORD NO.:  [id number]          PATIENT TYPE:  INP   LOCATION:  1536                         FACILITY:  [facility]   PHYSICIAN:  Sandria Bales. Ezzard Standing, M.D.  DATE OF BIRTH:  [birth year]   DATE OF CONSULTATION:  12/28/2008  DATE OF DISCHARGE:                                 CONSULTATION   REASON FOR CONSULTATION:  Obstructing colonic lesion.   HISTORY OF PRESENT ILLNESS:  This is a 50 year old white female, who is  a patient of Dr. Alonza Smoker, who for maybe 3 or 4 weeks has noticed  increasing abdominal distention.  She says she has noticed gurgling, and  she kind of dates this I think from like November 30, 2008.  She went to  [facility] where she felt more distended or bloated.  Then this past  Monday, which would be the 8th of February, because of her bloating she  decided to just fast.  Now, she has eaten some chips and corn chips or  something but has done nothing else.   She denies any history of peptic ulcer disease, liver disease,  gallbladder disease, or colonic disease.  She has never had a  colonoscopy.  She has had no prior abdominal surgery.  From a family  history, her mother had colon surgery just 2 years ago, lives in [place] but this was not for cancer.  Her mother apparently does  have breast cancer.   PAST MEDICAL HISTORY:  1. Her allergies are to PENICILLIN.  2. She is on no medicines right now.   REVIEW OF SYSTEMS:  UROLOGIC:  No history of kidney stones or kidney  infections.  MUSCULOSKELETAL:  She has no joint or back trouble.  NEUROLOGIC:  She has no history of seizure or loss of consciousness.  PULMONARY:  There is no history of cigarettes, no history of pneumonia  or tuberculosis.  CARDIAC:  She has had no heart disease, chest pain, or hypertension.  GASTROINTESTINAL:  see history of present illness   She is engaged to be married.  She has a fiance, who is not here, I do  not have  his name.  She worked for [employer] but has taken some time off from  that job so she is not working right now.   PHYSICAL EXAMINATION:  GENERAL:  She is a well-nourished, mildly obese,  white female.  VITAL SIGNS:  Her temperature is 98.3, her pulse is 81, blood pressure  111/63.  HEENT:  Unremarkable.  MOUTH:  No obvious oral lesion or mass.  NECK:  Supple.  I felt no masses or thyromegaly.  LUNGS:  Clear to auscultation.  HEART:  Regular rate and rhythm, but I do hear about a 2/6 systolic  murmur.  ABDOMEN:  Her abdomen has a full feeling.  She is mild to moderately  distended.  I think part of it is obesity and part of it is positive for  a distended bowel.  She has a little bit of soreness on palpation over  her mid abdomen.  RECTAL:  I cannot feel any rectal mass or lesion.  EXTREMITIES:  She has good strength in all 4 extremities.  NEUROLOGIC:  Grossly intact.   LABORATORY DATA:  Labs that I have show a PT of 13.5 with an INR of 1.0.  Her sodium is 133, potassium 2.7, chloride 100, glucose is 255, her  calcium is 8.3.  Her hemoglobin is 16, hematocrit 49, white blood cell  count 15,000, platelets of 396,000.  She does not have a CEA yet that I  can see.   A CT of her abdomen, which was done last night and read by Dr. Charlett Nose, shows a distended colon, which is fluid-filled down to the mid to  distal sigmoid colon where she has an obstruction and possible soft  tissue fullness concerning for a possible colon cancer.   IMPRESSION:  1. Obstructing sigmoid colon lesion potentially malignant.  Discussed      with the patient about the options for proceeding with surgery      versus other options.  Also, Dr. Derrell Lolling, who is with me this      weekend, reviewed things with the patient.  We have asked Dr.      Jeani Hawking to see her, and he is going to consider an endoscopy      with a possible stent placement knowing that he needs to be careful      how the  distal end of the stent because of the need to get a distal      margin on this mass.   If he can successfully stent and decompress her, that would buy her time  to prep her bowel.  If he cannot, then she will need more urgent surgery  and most likely would need a colostomy, and I discussed and drew  pictures of this with her.   Her final pathology will dictate what remaining treatments she may need.  1. Heart murmur.  2. Family history of breast cancer.  3. Elevated serum glucose.  Undiagnoses diabetes mellitus versus      stress hyperglycemia.      Sandria Bales. Ezzard Standing, M.D.  Electronically Signed     DHN/MEDQ  D:  12/28/2008  T:  12/28/2008  Job:  [id number]   cc:   Vernice Jefferson, MD  [facility] [address]   Jordan Hawks. Elnoria Howard, MD  Fax: [phone]

## 2011-03-30 NOTE — H&P (Signed)
NAME:  HUNTER, PINKARD NO.:  [id number]   MEDICAL RECORD NO.:  [id number]          PATIENT TYPE:  INP   LOCATION:  1536                         FACILITY:  [facility]   PHYSICIAN:  Eugenio Hoes. Tawanna Cooler, MD    DATE OF BIRTH:  Oct 27, 1961   DATE OF ADMISSION:  12/27/2008  DATE OF DISCHARGE:                              HISTORY & PHYSICAL   Breanna Hall is a 50 year old single female, who came to the office today for  evaluation of 4 weeks of abdominal discomfort and bloating.   She states she felt ill with nausea and vomiting x2 on January 16th.  She thought she may be getting a viral infection, however, did not  develop any fever, chills, or diarrhea.  She felt well after that except  she has felt continuously bloated.  She states she also noticed some  abdominal gurgling.  She had no fever, chills, vomiting, and states she  had normal bowel movements.  On February 5th, she developed in addition  to the bloating some generalized abdominal pain.  She describes it as a  dull ache all over, diffuse, did not radiate, was not affected it by  eating.  This past Monday, she stopped eating because of the nausea.  Since Monday, she has only had clear liquids.  Yesterday, the pain  became worse and she went to [facility].  At that time, she had a  15,000 white count, a KUB that showed no evidence of free air, but  mucosal thickening of the colon, CT scan was recommended.  The patient  was sent home, called here on Friday and was seen by me.  At that time,  her physical exam was normal except for distended abdomen, diffuse  abdominal tenderness, no rebound.  A pelvic was normal, rectal was  normal, a stool guaiac negative.  Because of the bloating, she was  referred to the emergency room for further evaluation.  In the emergency  room, her labs were normal except potassium is 2.6.  IV fluids were  started, CT scan with contrast was done, which showed an obstruction in  her descending colon,  most likely a malignancy.  She was therefore  admitted around 10 p.m. last night to the hospital.  Since she was  clinically stable and no evidence of perforation, we decided to put her  at bedrest, IV fluids, NPO.  I called Dr. Ovidio Kin around 10 p.m.  last night, we discussed her case, he concurred that since she was  otherwise well he would see her first thing in the morning.  He saw her  first thing on Saturday morning and recommended immediate surgery.  However, she would like a second opinion.  Dr. Derrell Lolling came by, talked to  her, they decided to do a colonoscopy prior to any surgery.  However,  ultimately she will need an operation to remove this tumor.  She states  her mother had a benign tumor in her colon, and hopefully that is her  condition, also.   PAST MEDICAL HISTORY:  She was hospitalized as a child for a  T and A.  Otherwise, no illnesses, no injuries.  She takes no medication on a  regular basis, does not drink except for an occasional beer, does not  smoke.  She has no med allergies except she says she gets a bad headache  with penicillin.  She does not get any hives nor shortness of breath.   Her last office visit with me was 10 years ago.  Her last pelvic exam  was a year ago by GYN and she states it was normal.  She had a normal  period 2 weeks ago, they use condoms for birth control.  Her live-in SO  is [name], also a patient of mine.  She works for [employer] and  [employer].   REVIEW OF SYSTEMS:  HEAD, EYES, EARS, NOSE, THROAT:  Negative.  CARDIOPULMONARY:  Negative.  GI:  Negative except for above.  GU:  Negative.  GYN:  Gravida 0, para 0.  We did a Pap when we saw her  yesterday.  MUSCULOSKELETAL:  Negative.  ENDO:  Negative.  PSYCH:  Negative.   FAMILY HISTORY:  Father has a history of hypertension and  hyperlipidemia, mother has a history of breast cancer, she had a benign  tumor removed from her colon.  No brothers, 1 sister, sister in good   health.   PHYSICAL EXAMINATION:  VITAL SIGNS:  Weight was 227, temp was 98, BP  132/92, pulse 70 and regular, respirations 12 and regular, height 5 feet  8.  GENERAL:  She is a well-developed, well-nourished, white female in no  acute distress.  HEAD, EYES, EARS, NOSE, THROAT:  Exam was negative.  NECK:  Supple, thyroid is not enlarged, no carotid bruits.  CHEST:  Clear to auscultation.  CARDIAC:  Exam was negative.  BREASTS:  Exam was negative.  ABDOMEN:  Exam showed the abdomen to be bloated, it was somewhat  tympanitic with percussion.  She had diffuse abdominal tenderness, no  rebound, no palpable masses.  PELVIC EXAMINATION:  External genitalia within normal limits, the  vaginal vault was normal, the cervix was visualized, and Pap smear was  done.  Bimanual exam negative.  RECTAL:  Normal, stool guaiac negative.  EXTREMITIES:  Normal, skin normal, peripheral pulses normal.   LABORATORY DATA:  As above.   IMPRESSION:  Bloating x1 month secondary to most likely a malignant  colon lesion.   PLAN:  Admit, IV fluids, NPO, Dr. Ovidio Kin has seen her on this  Saturday morning, further diagnostic workup per Surgery.  We will  transfer her to the Surgery Service because her problems obviously are  not general medical, they are surgical.      Tinnie Gens A. Tawanna Cooler, MD  Electronically Signed     JAT/MEDQ  D:  12/28/2008  T:  12/28/2008  Job:  [id number]

## 2011-03-30 NOTE — Op Note (Signed)
NAME:  Breanna Hall, Breanna Hall NO.:  [id number]   MEDICAL RECORD NO.:  [id number]          PATIENT TYPE:  INP   LOCATION:  1536                         FACILITY:  [facility]   PHYSICIAN:  Sandria Bales. Ezzard Standing, M.D.  DATE OF BIRTH:  07-06-1961   DATE OF PROCEDURE:  01/01/2009  DATE OF DISCHARGE:                               OPERATIVE REPORT   Date of surgery ?   PREOPERATIVE DIAGNOSIS:  Sigmoid colon obstruction, probably benign.   POSTOPERATIVE DIAGNOSES:  Sigmoid colon obstruction with stricture at  distal sigmoid colon, probably secondary to diverticulitis versus  endometriosis, but no evidence of malignancy.   PROCEDURE:  Laparoscopic converted to open sigmoid colectomy.  The  anastomosis was with a 33 Ethicon EEA stapler.  Rigid sigmoidoscopy.   SURGEON:  Dr. Ezzard Standing   FIRST ASSISTANT:  Dr. Colin Benton.   ANESTHESIA:  General endotracheal.   ESTIMATED BLOOD LOSS:  300 mL.   DRAINS LEFT IN:  Telfa wicks.   INDICATIONS FOR PROCEDURE:  Breanna Hall is a 50 year old white female  patient of Dr. Alonza Smoker, who presented with acute colonic obstruction.  We spent 4 days cleaning our her colon.  Dr. Jeani Hawking did a  colonoscopy on her three times and we left a rectal tube in for several  days.  She now comes for attempted resection of a segment of her sigmoid  colon.  He did not see any mucosal lesions.  So this is tought to be a  benign stricture, but I do not think the obstruction can resolve without  surgery.   I discussed with her the options and potential complications.  The risks  of surgery include bleeding, infection which may be part of the cause of  this obstruction, colostomy, and the need for open surgery.   OPERATIVE NOTE:  The patient placed in lithotomy position and given a  general endotracheal anesthetic.  Her abdomen was prepped with CHG.  The  perineum prepped with Betadine.  She was in stirrups.  Foley catheter  placed.  She was given a gram of cefoxitin  at the initiation of the  procedure.  A timeout was held identifying the patient and procedure.   Access to the abdominal cavity through the left upper quadrant with 5 mm  Optiview trocar.  Abdominal exploration revealed the right and left  lobes of the liver unremarkable, the stomach unremarkable.  The bowel  was really covered with omentum.  She is heavy and had a lot of intra-  abdominalfat.  I placed three additional trocars; a 5 mm left lower  quadrant, 5 mm midline and 5 mm right lower quadrant trocars.  I was  able to get down and look in the pelvis.  Her right ovary and tube were  unremarkable.  Her left ovary and tube were captured in this inflamed  area around her sigmoid colon.  I was able to mobilize up the left ovary  and left tube.  I took photos of this which I put in the chart.  She had  a loop of small bowel stuck posteriorly to the  sigmoid colon that I  could not free up and I thought I could not do safely  laparoscopically.  Also this was a very dense hard scar along the left pelvic rim.   I tried a hand-assisted port in the abdomen, but again the colon was so  fixed to her left pelvic brim that I felt uncomfortable trying to do  this laparoscopically or even hand-assisted, so I converted to an open  operation.   I packed her bowel out of the operative field.  I was able to get down  below this loop of small bowel and bring this up.  She had two loops of  small bowel trapped in the sigmoid area.  The one loop had a little  serosal tear that I put two 3-0 silk sutures, the loop was okay.  Once  we got small bowel up, I could then visualize the sigmoid colon.  She  had the left ovary and tube anterior to this and this was densely  adhered along the left pelvic sidewall.  I went proximal to this about 3-  4 cm where the bowel was dilated.  I divided it with a 70-mm GIA  stapler.  I then took this off the left pelvic sidewall, identified the  ureter behind this  dissection.  It was a well posterior dissection.  I  thought I tracked into the pelvic sidewall.  I did not think was  captured up in the scar tissue.  I did give her some indigo carmine  while we were doing the procedure, but saw no dye or no color in the  abdominal cavity.   Then, I was able to mobilize the sigmoid colon off the pelvic sidewall.  The scar tissue took the colon to about 6 cm above the peritoneal  reflection.  I tried to get about 2 cm below this and about 4 cm above  the peritoneal reflection.  I then used the blue load of the contour  stapler distal and resected the segment.  There was no evidence of any  obvious cancer, but there was a very dense scar.  I am guessing this is  either diverticular disease, less likely endometriosis.  Dr. Garnette Czech looked at this as the pathologist, and she saw no mucosal lesion.   I then freshened up the proximal end and cut out a segment of bowel.  A  #33 Ethicon Endo-GIA sizer fit easily in this proximal lumen.  Actually  my concern was the more distal lumen  which was the opposite of what we  normally see in doing these anastomoses.   I inserted the 33 sizer.  Dr. Colin Benton went below to pass the 33 mm Ethicon  EEA stapler.  She got to about 15 cm and then the remaining segment of  the bowel took a dive posteriorly.  It actually looked like the 33 size  may have been a little large to get all the way to the end of the bowel,  so I decided to come about 2 cm short to the staple which looked healthy  and good and come up through the anterior wall of the rectum about with  about 2 cm of the distal sigmoid colon pointing posteriorly.  She then  fired this bringing the EEA anvil down.  We got good proximal and distal  rings.  Then when Dr. Colin Benton insufflated the rectal stump, she saw a wide  open sigmoid colon to proximal rectal anastomosis.  The  patient did,  however, have a little tiny bubble leak on the right lateral wall.  I  was  able to three 2-0 silk sutures.  She then retested and there was no  leak from this.  The proximal bowel looked healthy.  We tested this at  least three times with just air insufflation and saw no further leak or  evidence of any compromise of the anastomosis.  I then took all the  packs out and there was no tension on this anastomotic line.  I was  hesitant to put any buttressing sutures around it for fear of creating  tension.  I thought I had an adequate anastomosis.  The bowel was  otherwise healthy, so I did not think I needed to put a diverting or  preventative ostomy in that I thought I had found the area of leak and  sutured this.   I then irrigated the pelvis out with 4 liters of saline.  I returned the  omentum to the lower abdominal cavity.  We re-inspected the small bowel.  NG tube was in placed in the stomach and checked.  I closed the fascia  with two running #1 PDS sutures, these were #1 looped PDS sutures, which  I tied in the middle of a suture.  I then irrigated the subcutaneous  tissues with 500 mL of saline and closed the skin with skin staple, put  Telfa wicks and stapled up her trocar sites.   She tolerated the procedure well, though this was a somewhat lengthy  operation, taking probably 3-1/2 to 4 hours.  This was mainly due to  both the scar in the left pelvic wall and trying to identify the ureter  which we probably spent better than 30-45 minutes trying to identify.   The patient tolerated the procedure well.  Sponge and needle counts were  correct at the end of the case.  I will leave an NG tube and go slow  feeding her until we were happy with this anastomosis.      Sandria Bales. Ezzard Standing, M.D.  Electronically Signed     DHN/MEDQ  D:  01/01/2009  T:  01/01/2009  Job:  [id number]   cc:   Tinnie Gens A. Tawanna Cooler, MD  [address] [place]  [place] [address]   Jordan Hawks. Elnoria Howard, MD  Fax: [phone]

## 2011-09-15 ENCOUNTER — Encounter (INDEPENDENT_AMBULATORY_CARE_PROVIDER_SITE_OTHER): Payer: Self-pay

## 2011-09-16 ENCOUNTER — Encounter (INDEPENDENT_AMBULATORY_CARE_PROVIDER_SITE_OTHER): Payer: Self-pay | Admitting: Surgery

## 2011-09-16 ENCOUNTER — Ambulatory Visit (INDEPENDENT_AMBULATORY_CARE_PROVIDER_SITE_OTHER): Payer: BC Managed Care – PPO | Admitting: Surgery

## 2011-09-16 VITALS — BP 140/82 | HR 84 | Temp 97.7°F | Resp 18 | Ht 67.0 in | Wt 207.0 lb

## 2011-09-16 DIAGNOSIS — R1031 Right lower quadrant pain: Secondary | ICD-10-CM

## 2011-09-16 NOTE — Progress Notes (Signed)
ASSESSMENT AND PLAN: 1.  Persistent RLQ pain.    She blames this on adhesions.  I am not so sure.    She just wanted to talked about the "adhesions" in her left shoulder and the "adhesions" in her abdomen.  Her return appointment to me is on a PRN basis.  2.  History of endometriosis of colon, resected - 01/01/2009.  3.  Ventral hernia repaired - 09/30/2009.  4.  Anxious.  5.  Left clavicle fx, now with frozen shoulder.  Chief Complaint  Patient presents with  . Abdominal Pain    ? scar tissue   REFERRING PHYSICIAN: TODD,JEFFREY ALLEN, MD  HISTORY OF PRESENT ILLNESS: Breanna Hall is a 50 y.o. (DOB: [birth year])  white female whose primary care physician is TODD,JEFFREY ALLEN, MD and comes to me today for abdominal pain, scar tissue?Marland Kitchen  The patient is well-known to me from her past history.  I did a sigmoid colon resection on 01 January 2009 for endometriosis of the colon. She  developed a ventral hernia which I repaired laparoscopically on 30 September 2009.  She has had a right lower quadrant abdominal pain on and all since her abdominal surgery. She attributes the abdominal pain to adhesions, but the cause of the pain is unclear to me. She is taking milk of magnesia daily to keep her bowels regular. She has had a colonoscopy since her sigmoid colon resection on 31 July 2009 by Dr. Jeani Hawking which saw some polyps, but no other abnormality.  She has no history of peptic ulcer disease, liver disease, or pancreatic disease.  For this visit, she just wanted to talk and explain what she is going through.  On November 07, 2010, she fell and broke her left clavicle. She arrived placed in the clavicle by a Dr. Georgeann Oppenheim in [place]. She did well early after repairing the clavicle, but is now developed a left frozen shoulder. She is talk to her orthopedic doctor about a manipulation to break up the adhesions in her left shoulder.  Past Medical History  Diagnosis Date  .  Bowel obstruction   . Endometriosis   . Diabetes mellitus     Past Surgical History  Procedure Date  . Ventral hernia repair 09-30-09  . Colectomy Feb 2010    sigmoid    No current outpatient prescriptions on file.    Allergies  Allergen Reactions  . Penicillins     REVIEW OF SYSTEMS: Skin:  No history of rash.  No history of abnormal moles. Infection:  No history of hepatitis or HIV.  No history of MRSA. Neurologic:  No history of stroke.  No history of seizure.  No history of headaches. Cardiac:  No history of hypertension. No history of heart disease.  No history of prior cardiac catheterization.  No history of seeing a cardiologist. Pulmonary:  Does not smoke cigarettes.  No asthma or bronchitis.  No OSA/CPAP.  Gastrointestinal:  See HPI. Urologic:  No history of kidney stones.  No history of bladder infections. Musculoskeletal:  Fractured left clavicle 11/07/2010.  Required rod.  Now with frozen shoulder.  Seen by Dr. Georgeann Oppenheim in [facility].  Plans manipulation under anesthesia. Hematologic:  No bleeding disorder.  No history of anemia.  Not anticoagulated. Psycho-social:  The patient is oriented.   Has a fear of cancer.  SOCIAL and FAMILY HISTORY: She works for [employer]. Has now bought a house at [facility].  We talked about that a little.  She just wanted  to catch me up on what is going on with her and her concerns about her abdomen. Has a cancer phobia.  PHYSICAL EXAM: BP 140/82  Pulse 84  Temp(Src) 97.7 F (36.5 C) (Temporal)  Resp 18  Ht 5\' 7"  (1.702 m)  Wt 207 lb (93.895 kg)  BMI 32.42 kg/m2  No physical exam, we talked for 20 minutes.    Ovidio Kin, M.D., [facility], [place] [phone]

## 2012-03-21 ENCOUNTER — Ambulatory Visit: Payer: Self-pay | Admitting: Obstetrics and Gynecology

## 2015-10-31 ENCOUNTER — Ambulatory Visit (INDEPENDENT_AMBULATORY_CARE_PROVIDER_SITE_OTHER): Payer: BLUE CROSS/BLUE SHIELD | Admitting: Adult Health

## 2015-10-31 ENCOUNTER — Encounter: Payer: Self-pay | Admitting: Adult Health

## 2015-10-31 VITALS — BP 178/100 | Temp 98.3°F | Ht 67.0 in | Wt 202.5 lb

## 2015-10-31 DIAGNOSIS — Z7189 Other specified counseling: Secondary | ICD-10-CM

## 2015-10-31 DIAGNOSIS — R7309 Other abnormal glucose: Secondary | ICD-10-CM | POA: Diagnosis not present

## 2015-10-31 DIAGNOSIS — Z7689 Persons encountering health services in other specified circumstances: Secondary | ICD-10-CM

## 2015-10-31 DIAGNOSIS — I10 Essential (primary) hypertension: Secondary | ICD-10-CM | POA: Diagnosis not present

## 2015-10-31 LAB — BASIC METABOLIC PANEL
BUN: 9 mg/dL (ref 6–23)
CALCIUM: 10 mg/dL (ref 8.4–10.5)
CO2: 33 mEq/L — ABNORMAL HIGH (ref 19–32)
Chloride: 99 mEq/L (ref 96–112)
Creatinine, Ser: 0.72 mg/dL (ref 0.40–1.20)
GFR: 89.41 mL/min (ref >60.00)
Glucose, Bld: 410 mg/dL — ABNORMAL HIGH (ref 70–99)
POTASSIUM: 4.9 meq/L (ref 3.5–5.1)
SODIUM: 142 meq/L (ref 135–145)

## 2015-10-31 LAB — HEMOGLOBIN A1C: HEMOGLOBIN A1C: 13.7 % — AB (ref 4.6–6.5)

## 2015-10-31 MED ORDER — BENAZEPRIL-HYDROCHLOROTHIAZIDE 10-12.5 MG PO TABS: 1.0000 | ORAL_TABLET | Freq: Every day | ORAL | Status: DC

## 2015-10-31 NOTE — Progress Notes (Signed)
HPI:  Breanna Hall is here to establish care. She is a pleasant caucasian female who  has a past medical history of Bowel obstruction (HCC); Endometriosis; Diabetes mellitus; Hypertension; Colon polyp; and Frozen shoulder.  Last PCP and physical: 2010 with MD Tawanna Coolerodd  Has the following chronic problems that require follow up and concerns today:  Hypertension - When she went to her GYN it was noticed that her blood pressure was elevated. Today in the office her blood pressure is 178/110. She has never been on anti-hypertensive medications in the past. She denies any headaches or blurred vision. Does not monitor at home.   Elevated Glucose - She feels as though her glucose has been elevated because " i have been drinking a lot of water and feeling thirsty all the time." She has never been on medication for diabetes but states " I had diabetes at one time, but it went away."   ROS negative for unless reported above: fevers, chills,feeling poorly, unintentional weight loss, hearing or vision loss, chest pain, palpitations, leg claudication, struggling to breath,Not feeling congested in the chest, no orthopenia, no cough,no wheezing, normal appetite, no soft tissue swelling, no hemoptysis, melena, hematochezia, hematuria, falls, loc, si, or thoughts of self harm.  Immunizations: Diet: Does not eat healthy Exercise: Doe snot exercise Colonoscopy:2014 - five year plan Mammogram:[place] 2015  Past Medical History  Diagnosis Date  . Bowel obstruction (HCC)   . Endometriosis   . Diabetes mellitus   . Hypertension     high blood pressure readings  . Colon polyp     Past Surgical History  Procedure Laterality Date  . Ventral hernia repair  09-30-09  . Colectomy  Feb 2010    sigmoid  . Tonsillectomy and adenoidectomy  1989  . Clavicle excision  2012    Family History  Problem Relation Age of Onset  . Colon cancer Maternal Grandmother   . Lung cancer Maternal Grandfather   . Uterine  cancer Paternal Grandmother   . Breast cancer Mother   . Lung cancer Maternal Grandmother   . Brain cancer Maternal Grandfather   . Elevated Lipids Mother   . Elevated Lipids Father   . Hypertension Mother   . Diabetes Mother   . Diabetes Paternal Grandfather     Social History   Social History  . Marital Status: Single    Spouse Name: N/A  . Number of Children: N/A  . Years of Education: N/A   Social History Main Topics  . Smoking status: Never Smoker   . Smokeless tobacco: None  . Alcohol Use: No  . Drug Use: No  . Sexual Activity: Not Asked   Other Topics Concern  . None   Social History Narrative    No current outpatient prescriptions on file.  EXAMCeasar Mons:  Filed Vitals:   10/31/15 1316  Temp: 98.3 F (36.8 C)    Body mass index is 31.71 kg/(m^2).  GENERAL: vitals reviewed and listed above, alert, oriented, appears well hydrated and in no acute distress. Slightly obese  HEENT: atraumatic, conjunttiva clear, no obvious abnormalities on inspection of external nose and ears  NECK: Neck is soft and supple without masses, no adenopathy or thyromegaly, trachea midline, no JVD. Normal range of motion.   LUNGS: clear to auscultation bilaterally, no wheezes, rales or rhonchi, good air movement  CV: Regular rate and rhythm, normal S1/S2, no audible murmurs, gallops, or rubs. No carotid bruit and no peripheral edema.   MS: moves all extremities  without noticeable abnormality. No edema noted  Abd: soft/nontender/nondistended/normal bowel sounds   Skin: warm and dry, no rash   Extremities: No clubbing, cyanosis, or edema. Capillary refill is WNL. Pulses intact bilaterally in upper and lower extremities.   Neuro: CN II-XII intact, sensation and reflexes normal throughout, 5/5 muscle strength in bilateral upper and lower extremities. Normal finger to nose. Normal rapid alternating movements. Normal romberg. No pronator drift.   PSYCH: pleasant and cooperative, no  obvious depression or anxiety  ASSESSMENT AND PLAN:  1. Encounter to establish care - Follow up in two weeks for recheck - Follow up sooner if needed - Follow up in January/Feb for CPE  2. Essential hypertension- - She is visibly upset at how elevated her blood pressure is. She states " I need to start eating better and exercising." I am hoping that this will be motivation to help push her in the right direction when it comes to diet and exercise. - benazepril-hydrochlorthiazide (LOTENSIN HCT) 10-12.5 MG tablet; Take 1 tablet by mouth daily.  Dispense: 30 tablet; Refill: 3 - Basic metabolic panel - Hemoglobin A1c  3. Elevated glucose - Basic metabolic panel - Hemoglobin A1c - Will consider adding medication once labs are resulted.   -We reviewed the PMH, PSH, FH, SH, Meds and Allergies. -We provided refills for any medications we will prescribe as needed. -We addressed current concerns per orders and patient instructions. -We have asked for records for pertinent exams, studies, vaccines and notes from previous providers. -We have advised patient to follow up per instructions below.   -Patient advised to return or notify a provider immediately if symptoms worsen or persist or new concerns arise.   [employer]

## 2015-10-31 NOTE — Patient Instructions (Addendum)
It was great meeting you today!  I have sent in a prescription for a blood pressure medication called Lotensin HCT. This will decrease your blood pressure.   Pick up a blood pressure cuff and monitor your blood pressures in the morning and night. Write them down and bring with you to your next appointment. If you feel dizzy or lightheaded, please let me know.   I will follow up with you regarding your lab work  Work on diet and exercise   Follow up with me in 2 weeks.

## 2015-10-31 NOTE — Progress Notes (Signed)
Pre visit review using our clinic review tool, if applicable. No additional management support is needed unless otherwise documented below in the visit note. 

## 2015-11-03 ENCOUNTER — Telehealth: Payer: Self-pay | Admitting: Adult Health

## 2015-11-03 NOTE — Telephone Encounter (Signed)
Attempted to call patient with lab results. Left VM to call back

## 2015-11-04 ENCOUNTER — Telehealth: Payer: Self-pay | Admitting: Adult Health

## 2015-11-04 NOTE — Telephone Encounter (Signed)
Left VM to call back regarding labs  

## 2015-11-05 ENCOUNTER — Telehealth: Payer: Self-pay | Admitting: Adult Health

## 2015-11-05 NOTE — Telephone Encounter (Signed)
Ms. Breanna Hall called saying she knows [name] wants her to begin taking BP meds but she can "only do one thing at a time." She wants to wait until after January 13th before she seriously discusses taking BP medication. 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 Please call the pt if necessary.  Pt's ph# [phone] Thank you.

## 2015-11-06 ENCOUNTER — Telehealth: Payer: Self-pay | Admitting: Adult Health

## 2015-11-06 ENCOUNTER — Other Ambulatory Visit: Payer: Self-pay | Admitting: Adult Health

## 2015-11-06 MED ORDER — METFORMIN HCL 500 MG PO TABS: 500.0000 mg | ORAL_TABLET | Freq: Two times a day (BID) | ORAL | Status: DC

## 2015-11-06 NOTE — Telephone Encounter (Signed)
FYI Pt is calling to let  cory know she is taking her blood pressure med

## 2015-11-06 NOTE — Telephone Encounter (Deleted)
Dr. Clent RidgesFry has left the office for the day.  I discussed this with Breanna Hall and she advised that pt wait 24 hours after last dose before consuming alcohol.  Left a message for return call.

## 2015-11-06 NOTE — Telephone Encounter (Signed)
Left a voicemail informing the patient that I did not think it was a good idea that she wait to start taking her blood pressure medications. Also advised that I had sent in a prescription for Metformin 500 mg BID. Her last a1C was 13.7. I advised her that elevated blood sugars at this level could lead to DKA and/or coma. She needs to start monitoring her blood sugars at home and eating better. I would like to start her on insulin but she does not want that at this point.

## 2015-11-12 NOTE — Telephone Encounter (Signed)
Pls advise.  

## 2015-11-12 NOTE — Telephone Encounter (Signed)
That is a great first step. What about her diabetes medication?

## 2015-11-12 NOTE — Telephone Encounter (Signed)
Left a message for pt to return call to advise about her diabetes medication.

## 2015-11-14 NOTE — Telephone Encounter (Signed)
Left a message for return call.  

## 2015-11-20 NOTE — Telephone Encounter (Signed)
Left a message for return call.  

## 2015-11-28 ENCOUNTER — Ambulatory Visit: Payer: BLUE CROSS/BLUE SHIELD | Admitting: Adult Health

## 2015-12-05 ENCOUNTER — Encounter: Payer: Self-pay | Admitting: Adult Health

## 2015-12-05 ENCOUNTER — Ambulatory Visit (INDEPENDENT_AMBULATORY_CARE_PROVIDER_SITE_OTHER): Payer: BLUE CROSS/BLUE SHIELD | Admitting: Adult Health

## 2015-12-05 VITALS — BP 144/94 | Temp 98.2°F | Ht 67.0 in | Wt 194.9 lb

## 2015-12-05 DIAGNOSIS — IMO0001 Reserved for inherently not codable concepts without codable children: Secondary | ICD-10-CM

## 2015-12-05 DIAGNOSIS — E1165 Type 2 diabetes mellitus with hyperglycemia: Secondary | ICD-10-CM | POA: Diagnosis not present

## 2015-12-05 DIAGNOSIS — I10 Essential (primary) hypertension: Secondary | ICD-10-CM

## 2015-12-05 MED ORDER — LISINOPRIL 20 MG PO TABS: 20.0000 mg | ORAL_TABLET | Freq: Every day | ORAL | Status: DC

## 2015-12-05 NOTE — Progress Notes (Signed)
Subjective:    Patient ID: Breanna Hall, female    DOB: [birth year], 55 y.o.   MRN: [id number]  HPI  55 year old female who presents to the office today for follow up regarding her blood pressure and diabetes. I last saw her on 10/31/2015 at which time her BP in the office was 178/100 and her A1c was 13.7. I had started her on Lotensin 10-12.5 mg and Metformin 500 ( due to her being reluctant to go on any medication).   Today she reports that she has not started Metformin, she has been out of her blood pressure for two days and reports that she has been experiencing the " the feeling of a sunburn on my left thigh." The discomfort of this has been getting better since running out of Lotensin.   She has lost 8 pounds and is exercising. She is eating healthier.    Review of Systems  Constitutional: Negative.   HENT: Negative.   Eyes: Negative.   Respiratory: Negative.   Cardiovascular: Negative.   Gastrointestinal: Negative.   Endocrine: Negative.   Genitourinary: Negative.   Musculoskeletal: Negative.   Neurological: Negative.   Hematological: Negative.   Psychiatric/Behavioral: Negative.   All other systems reviewed and are negative.  Past Medical History  Diagnosis Date  . Bowel obstruction ([place])   . Endometriosis   . Diabetes mellitus   . Hypertension     high blood pressure readings  . Colon polyp   . Frozen shoulder     Social History   Social History  . Marital Status: Single    Spouse Name: N/A  . Number of Children: N/A  . Years of Education: N/A   Occupational History  . Not on file.   Social History Main Topics  . Smoking status: Never Smoker   . Smokeless tobacco: Not on file  . Alcohol Use: No  . Drug Use: No  . Sexual Activity: Not on file   Other Topics Concern  . Not on file   Social History Narrative   Works in a call center answering phose   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       Past Surgical History  Procedure Laterality Date  . Ventral  hernia repair  09-30-09  . Colectomy  Feb 2010    sigmoid  . Tonsillectomy and adenoidectomy  1989  . Clavicle excision  2012    Family History  Problem Relation Age of Onset  . Colon cancer Maternal Grandmother   . Lung cancer Maternal Grandfather     Smoker  . Uterine cancer Paternal Grandmother   . Breast cancer Mother 66    Smoker  . Lung cancer Maternal Grandmother   . Brain cancer Maternal Grandfather   . Elevated Lipids Mother   . Elevated Lipids Father   . Hypertension Mother   . Diabetes Mother   . Diabetes Paternal Grandfather     Allergies  Allergen Reactions  . Penicillins     Current Outpatient Prescriptions on File Prior to Visit  Medication Sig Dispense Refill  . metFORMIN (GLUCOPHAGE) 500 MG tablet Take 1 tablet (500 mg total) by mouth 2 (two) times daily with a meal. (Patient not taking: Reported on 12/05/2015) 30 tablet 3   No current facility-administered medications on file prior to visit.    BP 144/94 mmHg  Temp(Src) 98.2 F (36.8 C) (Oral)  Ht _0  (1.702 m)  Wt 194 lb 14.4 oz (88.406 kg)  BMI 30.52 kg/m2  Objective:   Physical Exam  Constitutional: She is oriented to person, place, and time. She appears well-developed and well-nourished. No distress.  Cardiovascular: Normal rate, regular rhythm, normal heart sounds and intact distal pulses.  Exam reveals no gallop and no friction rub.   No murmur heard. Pulmonary/Chest: Effort normal and breath sounds normal. No respiratory distress. She has no wheezes. She has no rales. She exhibits no tenderness.  Musculoskeletal: Normal range of motion. She exhibits no edema or tenderness.  Neurological: She is alert and oriented to person, place, and time.  Skin: Skin is warm and dry. No rash noted. She is not diaphoretic. No erythema. No pallor.  Psychiatric: She has a normal mood and affect. Her behavior is normal. Thought content normal.  Nursing note and vitals reviewed.      Assessment  & Plan:  1. Essential hypertension - Discontinue Lotensin as possible side effect of HCTZ.  - lisinopril (PRINIVIL,ZESTRIL) 20 MG tablet; Take 1 tablet (20 mg total) by mouth daily.  Dispense: 30 tablet; Refill: 3 - BMP with eGFR - Follow up in one month  2. Uncontrolled type 2 diabetes mellitus without complication, without long-term current use of insulin ([place]) - Advised that we start insulin therapy. She refused. Will start Metformin today. Was advised to let me know if she has any GI upset.  - BMP with eGFR - Much improved on medication

## 2015-12-05 NOTE — Patient Instructions (Signed)
It was great seeing you today!  Discontinue with Lotensin and start Lisinopril.   Start the Metformin and continue to eat healthy as well as exercise.   I will follow up with you regarding your blood work  Follow up with me in one month

## 2015-12-05 NOTE — Progress Notes (Signed)
Pre visit review using our clinic review tool, if applicable. No additional management support is needed unless otherwise documented below in the visit note. 

## 2015-12-06 LAB — BASIC METABOLIC PANEL WITH GFR
BUN: 11 mg/dL (ref 7–25)
CO2: 27 mmol/L (ref 20–31)
Calcium: 9.8 mg/dL (ref 8.6–10.4)
Chloride: 93 mmol/L — ABNORMAL LOW (ref 98–110)
Creat: 0.74 mg/dL (ref 0.50–1.05)
GFR, Est African American: >89 mL/min (ref >=60)
GLUCOSE: 388 mg/dL — AB (ref 65–99)
POTASSIUM: 4.3 mmol/L (ref 3.5–5.3)
Sodium: 132 mmol/L — ABNORMAL LOW (ref 135–146)

## 2015-12-12 ENCOUNTER — Telehealth: Payer: Self-pay | Admitting: Adult Health

## 2015-12-12 MED ORDER — MELOXICAM 15 MG PO TABS: 15.0000 mg | ORAL_TABLET | Freq: Every day | ORAL | Status: DC

## 2015-12-12 NOTE — Telephone Encounter (Signed)
Called and spoke with pt.  Pt would like to try Mobic. Rx sent to the pharmacy #[id number] with 0 rf.  Pt is aware.

## 2015-12-12 NOTE — Telephone Encounter (Signed)
Called and spoke with pt; pt states it is pain (burning sensation pt had to bp medicine).  Pt states she has been doing ibuprofen- more than 600 mg and it is not touching her pain.  Pt has been up during the night.

## 2015-12-12 NOTE — Telephone Encounter (Signed)
I will prescribe her mobic if she would like.

## 2015-12-12 NOTE — Telephone Encounter (Signed)
We can send in a 30 day supply of Naproxen if she wants or she can take  Ibuprofen.   Do stretching exercises

## 2015-12-12 NOTE — Telephone Encounter (Signed)
Pt said she is having pain in left knee and can not sleep because she had a reaction from the bp med that was given to her in Dec. She said she was up to 2am because the leg was aching so bad. She is asking for something for pain.  Phone number [phone]    Pharmacy [facility] [place]

## 2015-12-12 NOTE — Addendum Note (Signed)
Addended by: Azucena Freed on: 12/12/2015 02:58 PM   Modules accepted: Orders

## 2015-12-26 ENCOUNTER — Telehealth: Payer: Self-pay | Admitting: Adult Health

## 2015-12-26 NOTE — Telephone Encounter (Signed)
Pt has had  been burning on left leg from a medication,  sister helped diagnosed pt :dysesthesia (sis is a drug rep) Pt states Kandee Keen is aware of her situation. But the muscle relaxer given for the burning has not helped at all. May be some type of neuropathy.  Pt  thinking she may need to take gabapentin.  Pt would like a call back. [facility] [address]  Pt may be in meetings this afternoon, ok to leave message

## 2015-12-28 NOTE — Telephone Encounter (Signed)
Will address this on 01/02/2016 unless she feels as though she needs to be seen sooner

## 2015-12-29 NOTE — Telephone Encounter (Signed)
Called and spoke with pt and pt states that she can wait until her appt on Friday.  Pt states she has been taking Hydrocodone and wanted Kandee Keen to know. Advised pt to call the office if further assistance is needed before her appt.

## 2016-01-02 ENCOUNTER — Ambulatory Visit (INDEPENDENT_AMBULATORY_CARE_PROVIDER_SITE_OTHER): Payer: BLUE CROSS/BLUE SHIELD | Admitting: Adult Health

## 2016-01-02 ENCOUNTER — Encounter: Payer: Self-pay | Admitting: Adult Health

## 2016-01-02 VITALS — BP 134/90 | Temp 98.1°F | Ht 67.0 in | Wt 191.1 lb

## 2016-01-02 DIAGNOSIS — E1165 Type 2 diabetes mellitus with hyperglycemia: Secondary | ICD-10-CM

## 2016-01-02 DIAGNOSIS — I10 Essential (primary) hypertension: Secondary | ICD-10-CM | POA: Diagnosis not present

## 2016-01-02 DIAGNOSIS — G629 Polyneuropathy, unspecified: Secondary | ICD-10-CM | POA: Diagnosis not present

## 2016-01-02 DIAGNOSIS — IMO0001 Reserved for inherently not codable concepts without codable children: Secondary | ICD-10-CM

## 2016-01-02 LAB — POCT GLYCOSYLATED HEMOGLOBIN (HGB A1C): Hemoglobin A1C: 13.4

## 2016-01-02 LAB — GLUCOSE, POCT (MANUAL RESULT ENTRY): POC Glucose: 363 mg/dl — AB (ref 70–99)

## 2016-01-02 MED ORDER — GABAPENTIN 300 MG PO CAPS: 300.0000 mg | ORAL_CAPSULE | Freq: Two times a day (BID) | ORAL | Status: DC

## 2016-01-02 MED ORDER — METFORMIN HCL 1000 MG PO TABS: 1000.0000 mg | ORAL_TABLET | Freq: Two times a day (BID) | ORAL | Status: DC

## 2016-01-02 MED ORDER — GLIPIZIDE 5 MG PO TABS: 5.0000 mg | ORAL_TABLET | Freq: Two times a day (BID) | ORAL | Status: DC

## 2016-01-02 NOTE — Progress Notes (Signed)
Pre visit review using our clinic review tool, if applicable. No additional management support is needed unless otherwise documented below in the visit note. 

## 2016-01-02 NOTE — Patient Instructions (Addendum)
It was great seeing you again!  You are making tremendous progress! Keep up the great work!   Start exercising   I have sent in a prescription for Gabapentin , take this twice a day.   We have increased your Metformin to 1000 mg and added Glipizide 5 mg.   Monitor your blood sugars twice a day and bring the log to your next appointment.   Follow up in one month.

## 2016-01-02 NOTE — Progress Notes (Signed)
Subjective:    Patient ID: Breanna Hall, female    DOB: Jan 20, 1961, 55 y.o.   MRN: [id number]  HPI  55 year old female who presents to to the office today for follow up regarding hypertension and diabetes. She has been taking her lisinopril and her blood pressure has been improving.   BP Readings from Last 3 Encounters:  01/02/16 134/90  12/05/15 144/94  10/31/15 178/100   She has started taking Metformin  BID for the last months. She has had one episode of diarrhea. She continues to lose weight through healthy eating, but has not started exercising yet.   Wt Readings from Last 3 Encounters:  01/02/16 191 lb 1.6 oz (86.682 kg)  12/05/15 194 lb 14.4 oz (88.406 kg)  10/31/15 202 lb 8 oz (91.853 kg)   She continues to complain of a " sunburn" feeling on her left thigh. The pain is constant and she is unable to get enough sleep. She reports that the pain is worse with touch and when clothes brush against it. The pain has been getting less severe as the month has gone on.    Review of Systems  Constitutional: Positive for appetite change.  Respiratory: Negative.   Cardiovascular: Negative.   Musculoskeletal: Positive for myalgias.  Neurological: Negative.   Hematological: Negative.   Psychiatric/Behavioral: Negative.   All other systems reviewed and are negative.  Past Medical History  Diagnosis Date  . Bowel obstruction ([facility])   . Endometriosis   . Diabetes mellitus   . Hypertension     high blood pressure readings  . Colon polyp   . Frozen shoulder     Social History   Social History  . Marital Status: Single    Spouse Name: N/A  . Number of Children: N/A  . Years of Education: N/A   Occupational History  . Not on file.   Social History Main Topics  . Smoking status: Never Smoker   . Smokeless tobacco: Not on file  . Alcohol Use: No  . Drug Use: No  . Sexual Activity: Not on file   Other Topics Concern  . 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 Ventral hernia repair  09-30-09  . Colectomy  Feb 2010    sigmoid  . Tonsillectomy and adenoidectomy  1989  . Clavicle excision  2012    Family History  Problem Relation Age of Onset  . Colon cancer Maternal Grandmother   . Lung cancer Maternal Grandfather     Smoker  . Uterine cancer Paternal Grandmother   . Breast cancer Mother 48    Smoker  . Lung cancer Maternal Grandmother   . Brain cancer Maternal Grandfather   . Elevated Lipids Mother   . Elevated Lipids Father   . Hypertension Mother   . Diabetes Mother   . Diabetes Paternal Grandfather     Allergies  Allergen Reactions  . Penicillins     Current Outpatient Prescriptions on File Prior to Visit  Medication Sig Dispense Refill  . lisinopril (PRINIVIL,ZESTRIL) 20 MG tablet Take 1 tablet (20 mg total) by mouth daily. 30 tablet 3  . metFORMIN (GLUCOPHAGE) 500 MG tablet Take 1 tablet (500 mg total) by mouth 2 (two) times daily with a meal. 30 tablet 3   No current facility-administered medications on  file prior to visit.    BP 134/90 mmHg  Temp(Src) 98.1 F (36.7 C) (Oral)  Ht  (1.702 m)  Wt 191 lb 1.6 oz (86.682 kg)  BMI 29.92 kg/m2       Objective:   Physical Exam  Constitutional: She is oriented to person, place, and time. She appears well-developed and well-nourished. No distress.  Cardiovascular: Normal rate, regular rhythm and intact distal pulses.  Exam reveals no gallop and no friction rub.   No murmur heard. Pulmonary/Chest: Effort normal and breath sounds normal. No respiratory distress. She has no wheezes. She has no rales. She exhibits no tenderness.  Musculoskeletal: Normal range of motion. She exhibits tenderness (left thigh). She exhibits no edema.  Neurological: She is alert and oriented to person, place, and time.  Skin: Skin is warm and dry. No rash  noted. No erythema. No pallor.  Psychiatric: She has a normal mood and affect. Her behavior is normal. Judgment and thought content normal.  Nursing note and vitals reviewed.      Assessment & Plan:  1. Uncontrolled type 2 diabetes mellitus without complication, without long-term current use of insulin ([facility]) - POC A1c is 13.4 in the office today. No change from her A1c in December.  - She refuses to go on insulin therapy. She also refuses to see a diabetic educator.  - Increase Metformin from 500 mg to 1000 mg  - Take glipizide .  - She needs to monitor blood sugars twice a day and write readings in log. Bring log to next visit.  - Consider endocrinology - she refuses to go now.   2. Neuropathy ([facility]) - gabapentin (NEURONTIN) 300 MG capsule; Take 1 capsule (300 mg total) by mouth 2 (two) times daily.  Dispense: 90 capsule; Refill: 1  3. Essential hypertension - Continues to improve  - No change.

## 2016-01-07 ENCOUNTER — Other Ambulatory Visit: Payer: Self-pay | Admitting: Adult Health

## 2016-01-07 NOTE — Telephone Encounter (Signed)
Declined refill request. It was not working for her

## 2016-01-30 ENCOUNTER — Encounter: Payer: Self-pay | Admitting: Adult Health

## 2016-01-30 ENCOUNTER — Ambulatory Visit (INDEPENDENT_AMBULATORY_CARE_PROVIDER_SITE_OTHER): Payer: BLUE CROSS/BLUE SHIELD | Admitting: Adult Health

## 2016-01-30 VITALS — BP 128/84 | Temp 97.4°F | Ht 67.0 in | Wt 188.5 lb

## 2016-01-30 DIAGNOSIS — I10 Essential (primary) hypertension: Secondary | ICD-10-CM

## 2016-01-30 DIAGNOSIS — E1165 Type 2 diabetes mellitus with hyperglycemia: Secondary | ICD-10-CM | POA: Diagnosis not present

## 2016-01-30 DIAGNOSIS — IMO0001 Reserved for inherently not codable concepts without codable children: Secondary | ICD-10-CM

## 2016-01-30 LAB — POCT GLYCOSYLATED HEMOGLOBIN (HGB A1C): HEMOGLOBIN A1C: 10.9

## 2016-01-30 MED ORDER — GLIPIZIDE 10 MG PO TABS: 10.0000 mg | ORAL_TABLET | Freq: Two times a day (BID) | ORAL | Status: DC

## 2016-01-30 NOTE — Progress Notes (Signed)
Subjective:    Patient ID: Breanna Hall, female    DOB: 06-Jun-1961, 55 y.o.   MRN: [id number]  HPI 55 year old female who returns to the office today for follow up regarding diabetes and hypertension. I last saw her 4 weeks ago. She reports that she is doing better. She continues to lose weight by eating healthy diet.   Wt Readings from Last 3 Encounters:  01/30/16 188 lb 8 oz (85.503 kg)  01/02/16 191 lb 1.6 oz (86.682 kg)  12/05/15 194 lb 14.4 oz (88.406 kg)     Diabetes - Her last A1c was 13.5, she is currently taking Metformin 500 mg and Glipizide 5 mg.She does not want to go see endocrinology  And does not want to be on insulin despite knowing her risks. She is not checking her blood sugars at home. She is not exercising due to multiple excuses that she makes.  Hypertension  - She appears to be becoming controlled with her blood pressure. Today in the office her BP is 128/84  BP Readings from Last 3 Encounters:  01/30/16 128/84  01/02/16 134/90  12/05/15 144/94    Review of Systems  Constitutional: Negative.   Respiratory: Negative.   Gastrointestinal: Negative.   Musculoskeletal: Negative.   Neurological: Negative.   All other systems reviewed and are negative.  Past Medical History  Diagnosis Date  . Bowel obstruction ([facility])   . Endometriosis   . Diabetes mellitus   . Hypertension     high blood pressure readings  . Colon polyp   . Frozen shoulder     Social History   Social History  . Marital Status: Single    Spouse Name: N/A  . Number of Children: N/A  . Years of Education: N/A   Occupational History  . Not on file.   Social History Main Topics  . Smoking status: Never Smoker   . Smokeless tobacco: Not on file  . Alcohol Use: No  . Drug Use: No  . Sexual Activity: Not on file   Other Topics Concern  . 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 Ventral hernia repair  09-30-09  . Colectomy  Feb 2010    sigmoid  . Tonsillectomy and adenoidectomy  1989  . Clavicle excision  2012    Family History  Problem Relation Age of Onset  . Colon cancer Maternal Grandmother   . Lung cancer Maternal Grandfather     Smoker  . Uterine cancer Paternal Grandmother   . Breast cancer Mother 6    Smoker  . Lung cancer Maternal Grandmother   . Brain cancer Maternal Grandfather   . Elevated Lipids Mother   . Elevated Lipids Father   . Hypertension Mother   . Diabetes Mother   . Diabetes Paternal Grandfather     Allergies  Allergen Reactions  . Penicillins     Current Outpatient Prescriptions on File Prior to Visit  Medication Sig Dispense Refill  . gabapentin (NEURONTIN) 300 MG capsule Take 1 capsule (300 mg total) by mouth 2 (two) times daily. 90 capsule 1  . glipiZIDE (GLUCOTROL) 5 MG tablet Take 1 tablet (5 mg total) by mouth 2 (two) times daily before a meal. 60 tablet 3  . lisinopril (PRINIVIL,ZESTRIL) 20 MG tablet Take 1 tablet (20 mg total)  by mouth daily. 30 tablet 3  . metFORMIN (GLUCOPHAGE) 1000 MG tablet Take 1 tablet (1,000 mg total) by mouth 2 (two) times daily with a meal. 60 tablet 3   No current facility-administered medications on file prior to visit.    BP 128/84 mmHg  Temp(Src) 97.4 F (36.3 C) (Oral)  Ht 5\' 7"  (1.702 m)  Wt 188 lb 8 oz (85.503 kg)  BMI 29.52 kg/m2      Objective:   Physical Exam  Constitutional: She is oriented to person, place, and time. She appears well-developed and well-nourished. No distress.  HENT:  Head: Normocephalic and atraumatic.  Right Ear: External ear normal.  Left Ear: External ear normal.  Nose: Nose normal.  Mouth/Throat: Oropharynx is clear and moist. No oropharyngeal exudate.  Eyes: Conjunctivae and EOM are normal. Pupils are equal, round, and reactive to light. Right eye exhibits no discharge. Left eye exhibits no discharge. No scleral  icterus.  Neck: Normal range of motion. Neck supple.  Cardiovascular: Normal rate, regular rhythm, normal heart sounds and intact distal pulses.  Exam reveals no gallop and no friction rub.   No murmur heard. Pulmonary/Chest: Effort normal and breath sounds normal. No respiratory distress. She has no wheezes. She has no rales. She exhibits no tenderness.  Musculoskeletal: Normal range of motion.  Neurological: She is alert and oriented to person, place, and time.  Skin: Skin is warm and dry. No rash noted. She is not diaphoretic. No erythema. No pallor.  Psychiatric: She has a normal mood and affect. Her behavior is normal. Judgment and thought content normal.  Nursing note and vitals reviewed.     Assessment & Plan:  1. Uncontrolled type 2 diabetes mellitus without complication, without long-term current use of insulin (HCC) - Consult to diabetes coordinator - glipiZIDE (GLUCOTROL) 10 MG tablet; Take 1 tablet (10 mg total) by mouth 2 (two) times daily before a meal.  Dispense: 60 tablet; Refill: 3 -POC  A1c during this visit was 10.3  - Follow up in 2 months 2. Essential hypertension - Near goal  - No change in medication

## 2016-01-30 NOTE — Addendum Note (Signed)
Addended by: Azucena FreedMILLNER, Khaalid Lefkowitz C on: 01/30/2016 03:49 PM   Modules accepted: Orders

## 2016-01-30 NOTE — Patient Instructions (Addendum)
It was great seeing you again!  YOUR A1C is 10.3!!!!  You are making great strides in regards to improving your health.   Goals for April 1. Hit the gym hard 2. Increase water consumption 3. Lose 5 pounds 4. Diabetic educator  5. Start monitoring your blood sugar 3 times a week.   I will see you in one month.

## 2016-03-28 ENCOUNTER — Other Ambulatory Visit: Payer: Self-pay | Admitting: Adult Health

## 2016-04-02 ENCOUNTER — Ambulatory Visit: Payer: BLUE CROSS/BLUE SHIELD | Admitting: Adult Health

## 2016-05-03 ENCOUNTER — Other Ambulatory Visit: Payer: Self-pay | Admitting: Adult Health

## 2016-05-03 NOTE — Telephone Encounter (Signed)
Refill sent to pharmacy.   

## 2016-05-30 ENCOUNTER — Other Ambulatory Visit: Payer: Self-pay | Admitting: Adult Health

## 2016-07-03 ENCOUNTER — Other Ambulatory Visit: Payer: Self-pay | Admitting: Adult Health

## 2016-07-03 DIAGNOSIS — IMO0001 Reserved for inherently not codable concepts without codable children: Secondary | ICD-10-CM

## 2016-07-03 DIAGNOSIS — E1165 Type 2 diabetes mellitus with hyperglycemia: Principal | ICD-10-CM

## 2016-08-30 ENCOUNTER — Other Ambulatory Visit: Payer: Self-pay | Admitting: Adult Health

## 2016-09-27 ENCOUNTER — Other Ambulatory Visit: Payer: Self-pay | Admitting: Adult Health

## 2016-09-27 DIAGNOSIS — E1165 Type 2 diabetes mellitus with hyperglycemia: Principal | ICD-10-CM

## 2016-09-27 DIAGNOSIS — IMO0001 Reserved for inherently not codable concepts without codable children: Secondary | ICD-10-CM

## 2016-09-27 NOTE — Telephone Encounter (Signed)
Ok to refill for 6 months 

## 2016-09-27 NOTE — Telephone Encounter (Signed)
Ok to refill 

## 2017-01-02 ENCOUNTER — Encounter: Payer: Self-pay | Admitting: Emergency Medicine

## 2017-01-02 ENCOUNTER — Emergency Department
Admission: EM | Admit: 2017-01-02 | Discharge: 2017-01-02 | Disposition: A | Discharge status: Home / Self Care | Payer: 59 | Attending: Emergency Medicine | Admitting: Emergency Medicine

## 2017-01-02 DIAGNOSIS — E119 Type 2 diabetes mellitus without complications: Secondary | ICD-10-CM | POA: Insufficient documentation

## 2017-01-02 DIAGNOSIS — Z7984 Long term (current) use of oral hypoglycemic drugs: Secondary | ICD-10-CM | POA: Diagnosis not present

## 2017-01-02 DIAGNOSIS — R0602 Shortness of breath: Secondary | ICD-10-CM | POA: Insufficient documentation

## 2017-01-02 DIAGNOSIS — I10 Essential (primary) hypertension: Secondary | ICD-10-CM | POA: Insufficient documentation

## 2017-01-02 DIAGNOSIS — Z5321 Procedure and treatment not carried out due to patient leaving prior to being seen by health care provider: Secondary | ICD-10-CM | POA: Diagnosis not present

## 2017-01-02 NOTE — ED Triage Notes (Signed)
Pt arrives POV with c/o difficulty breathing. Pt states that he has a hx of COPD. Pt is having a hard work of breathing with a non productive cough.

## 2017-01-02 NOTE — ED Notes (Signed)
Pt was not present in ED at this time. Pt in room was inadvertently put in incorrectly at time of registration.

## 2017-01-29 ENCOUNTER — Other Ambulatory Visit: Payer: Self-pay | Admitting: Adult Health

## 2017-01-29 DIAGNOSIS — IMO0001 Reserved for inherently not codable concepts without codable children: Secondary | ICD-10-CM

## 2017-01-29 DIAGNOSIS — E1165 Type 2 diabetes mellitus with hyperglycemia: Principal | ICD-10-CM

## 2017-02-01 NOTE — Telephone Encounter (Signed)
She can have thirty days. Needs office visit for more medication

## 2017-02-01 NOTE — Telephone Encounter (Signed)
Ok to refill 

## 2017-04-25 ENCOUNTER — Other Ambulatory Visit: Payer: Self-pay | Admitting: Adult Health

## 2017-06-04 ENCOUNTER — Other Ambulatory Visit: Payer: Self-pay | Admitting: Adult Health

## 2017-06-04 DIAGNOSIS — IMO0001 Reserved for inherently not codable concepts without codable children: Secondary | ICD-10-CM

## 2017-06-04 DIAGNOSIS — E1165 Type 2 diabetes mellitus with hyperglycemia: Principal | ICD-10-CM

## 2017-06-06 NOTE — Telephone Encounter (Signed)
Denied.  Pt needs to be seen in the office. 

## 2017-07-14 ENCOUNTER — Telehealth: Payer: Self-pay | Admitting: Adult Health

## 2017-07-14 DIAGNOSIS — E1165 Type 2 diabetes mellitus with hyperglycemia: Principal | ICD-10-CM

## 2017-07-14 DIAGNOSIS — IMO0001 Reserved for inherently not codable concepts without codable children: Secondary | ICD-10-CM

## 2017-07-14 NOTE — Telephone Encounter (Signed)
[facility] and [place] not been back [place] Pt is completely out of  lisinopril (PRINIVIL,ZESTRIL) 20 MG tablet glipiZIDE (GLUCOTROL) 10 MG tablet  Would like to know if 30 day refill ok and pt [facility]    [id number] [place] [place] - [address] [phone] (Phone) [phone] (Fax)

## 2017-07-15 MED ORDER — LISINOPRIL 20 MG PO TABS: 20.0000 mg | ORAL_TABLET | Freq: Every day | ORAL | 0 refills | Status: DC

## 2017-07-15 MED ORDER — GLIPIZIDE 10 MG PO TABS: ORAL_TABLET | ORAL | 0 refills | Status: DC

## 2017-07-15 NOTE — Telephone Encounter (Signed)
Call in 30 day supply for both

## 2017-07-15 NOTE — Telephone Encounter (Signed)
Sent to the pharmacy by e-scribe. 

## 2017-07-15 NOTE — Telephone Encounter (Signed)
Ok to fill?  Last seen 01/30/16.

## 2017-08-09 ENCOUNTER — Ambulatory Visit (INDEPENDENT_AMBULATORY_CARE_PROVIDER_SITE_OTHER): Payer: 59 | Admitting: Adult Health

## 2017-08-09 ENCOUNTER — Encounter: Payer: Self-pay | Admitting: Adult Health

## 2017-08-09 ENCOUNTER — Other Ambulatory Visit: Payer: Self-pay | Admitting: Adult Health

## 2017-08-09 VITALS — BP 166/104 | Temp 98.0°F | Ht 66.75 in | Wt 193.2 lb

## 2017-08-09 DIAGNOSIS — E1165 Type 2 diabetes mellitus with hyperglycemia: Secondary | ICD-10-CM | POA: Diagnosis not present

## 2017-08-09 DIAGNOSIS — I10 Essential (primary) hypertension: Secondary | ICD-10-CM

## 2017-08-09 DIAGNOSIS — IMO0001 Reserved for inherently not codable concepts without codable children: Secondary | ICD-10-CM

## 2017-08-09 LAB — BASIC METABOLIC PANEL
BUN: 13 mg/dL (ref 6–23)
CALCIUM: 9.2 mg/dL (ref 8.4–10.5)
CHLORIDE: 95 meq/L — AB (ref 96–112)
CO2: 31 meq/L (ref 19–32)
CREATININE: 0.64 mg/dL (ref 0.40–1.20)
GFR: 101.77 mL/min (ref >60.00)
Glucose, Bld: 370 mg/dL — ABNORMAL HIGH (ref 70–99)
Potassium: 3.9 mEq/L (ref 3.5–5.1)
Sodium: 136 mEq/L (ref 135–145)

## 2017-08-09 LAB — LIPID PANEL
CHOL/HDL RATIO: 7
Cholesterol: 345 mg/dL — ABNORMAL HIGH (ref 0–200)
HDL: 51.5 mg/dL (ref >39.00)
Triglycerides: 696 mg/dL — ABNORMAL HIGH (ref 0.0–149.0)

## 2017-08-09 LAB — LDL CHOLESTEROL, DIRECT: Direct LDL: 168 mg/dL

## 2017-08-09 LAB — HEMOGLOBIN A1C: Hgb A1c MFr Bld: 13.4 % — ABNORMAL HIGH (ref 4.6–6.5)

## 2017-08-09 MED ORDER — ATORVASTATIN CALCIUM 40 MG PO TABS
40.0000 mg | ORAL_TABLET | Freq: Every day | ORAL | 3 refills | Status: DC
Start: 2017-08-09 — End: 2018-07-31

## 2017-08-09 MED ORDER — LISINOPRIL 40 MG PO TABS: 40.0000 mg | ORAL_TABLET | Freq: Every day | ORAL | 3 refills | Status: DC

## 2017-08-09 MED ORDER — METFORMIN HCL 1000 MG PO TABS: ORAL_TABLET | ORAL | 1 refills | Status: DC

## 2017-08-09 MED ORDER — GABAPENTIN 300 MG PO CAPS: ORAL_CAPSULE | ORAL | 1 refills | Status: DC

## 2017-08-09 MED ORDER — GLIPIZIDE 10 MG PO TABS: ORAL_TABLET | ORAL | 1 refills | Status: DC

## 2017-08-09 MED ORDER — CANAGLIFLOZIN 100 MG PO TABS: 100.0000 mg | ORAL_TABLET | Freq: Every day | ORAL | 3 refills | Status: DC

## 2017-08-09 MED ORDER — LISINOPRIL 40 MG PO TABS: 40.0000 mg | ORAL_TABLET | Freq: Every day | ORAL | 1 refills | Status: DC

## 2017-08-09 NOTE — Patient Instructions (Addendum)
Follow up in 2 weeks for blood pressure check   Follow up in 3 months for diabetes check

## 2017-08-09 NOTE — Progress Notes (Signed)
Subjective:    Patient ID: Breanna Hall, female    DOB: 11-17-1960, 56 y.o.   MRN: [id number]  HPI  56 year old female who  has a past medical history of Bowel obstruction; Colon polyp; Diabetes mellitus; Endometriosis; Frozen shoulder; and Hypertension. She presents to the office today for follow up regarding diabetes and hypertension. I last saw her about 1.5 years ago at which time her A1c was 10.9. She was currently taking Metformin 1000 mg BID and Glipizide 10 mg BID. She was adamant about not taking any injectables   Her blood pressure was controlled during her last visit with 20 mg of lisinopril.   She has not returned due to " being busy" and having to take care of her parents. [place]   She was sent for a colonoscopy but it would not be done due to blood sugars 300 +   She reports that she has not been checking her blood sugars at home, she has not been eating a heart healthy diet and she has not been exercising.   Wt Readings from Last 3 Encounters:  08/09/17 193 lb 3.2 oz (87.6 kg)  01/02/17 201 lb (91.2 kg)  01/30/16 188 lb 8 oz (85.5 kg)    Review of Systems See HPI   Past Medical History:  Diagnosis Date  . Bowel obstruction (HCC)   . Colon polyp   . Diabetes mellitus   . Endometriosis   . Frozen shoulder   . Hypertension    high blood pressure readings    Social History   Social History  . Marital status: Single    Spouse name: N/A  . Number of children: N/A  . Years of education: N/A   Occupational History  . Not on file.   Social History Main Topics  . Smoking status: Never Smoker  . Smokeless tobacco: Never Used  . Alcohol use No  . Drug use: No  . Sexual activity: Not on file   Other Topics Concern  . 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  CLAVICLE EXCISION  2012  . COLECTOMY  Feb 2010   sigmoid  . TONSILLECTOMY AND ADENOIDECTOMY  1989  . VENTRAL HERNIA REPAIR  09-30-09    Family History  Problem Relation Age of Onset  . Breast cancer Mother 43       Smoker  . Elevated Lipids Mother   . Hypertension Mother   . Diabetes Mother   . Elevated Lipids Father   . Colon cancer Maternal Grandmother   . Lung cancer Maternal Grandmother   . Lung cancer Maternal Grandfather        Smoker  . Brain cancer Maternal Grandfather   . Uterine cancer Paternal Grandmother   . Diabetes Paternal Grandfather     Allergies  Allergen Reactions  . Penicillins     No current outpatient prescriptions on file prior to visit.   No current facility-administered medications on file prior to visit.     BP (!) 166/104 (BP Location: Left Arm, Patient Position: Sitting, Cuff Size: Normal)   Temp 98 F (36.7 C) (Oral)   Ht 5' 6.75" (1.695 m)   Wt 193 lb 3.2 oz (87.6 kg)   LMP 11/15/2010 (  Approximate)   BMI 30.49 kg/m       Objective:   Physical Exam  Constitutional: She is oriented to person, place, and time. She appears well-developed and well-nourished.  Cardiovascular: Normal rate, regular rhythm, normal heart sounds and intact distal pulses.  Exam reveals no gallop and no friction rub.   No murmur heard. Pulmonary/Chest: Effort normal and breath sounds normal. No respiratory distress. She has no wheezes. She has no rales. She exhibits no tenderness.  Neurological: She is alert and oriented to person, place, and time.  Skin: Skin is warm and dry. No rash noted. She is not diaphoretic. No erythema. No pallor.  Psychiatric: She has a normal mood and affect. Her behavior is normal. Judgment and thought content normal.  Nursing note and vitals reviewed.     Assessment & Plan:  1. Uncontrolled type 2 diabetes mellitus without complication, without long-term current use of insulin (HCC) - We had a long talk about diabetes and  complications that come from uncontrolled diabetes. She understands her risk factors, including, dialysis, stroke, or death. She needs to follow a diabetic diet and exercise.  - Will check labs. Likely place on third diabetic agent and possibly statin - I want her to follow up in 3 months  - Basic metabolic panel - Hemoglobin A1c - Lipid panel - glipiZIDE (GLUCOTROL) 10 MG tablet; TAKE 1 TABLET BY MOUTH TWICE A DAY BEFORE A MEAL  Dispense: 180 tablet; Refill: 1  2. Essential hypertension - No longer controlled. Will increase Lisinopril from 20 mg to 40 . Follow up in 2 weeks  - lisinopril (PRINIVIL,ZESTRIL) 40 MG tablet; Take 1 tablet (40 mg total) by mouth daily.  Dispense: 90 tablet; Refill: 1  Shirline Frees, NP

## 2017-08-19 ENCOUNTER — Encounter: Payer: Self-pay | Admitting: Adult Health

## 2017-08-19 ENCOUNTER — Ambulatory Visit (INDEPENDENT_AMBULATORY_CARE_PROVIDER_SITE_OTHER): Payer: 59 | Admitting: Adult Health

## 2017-08-19 VITALS — BP 154/90 | Temp 98.0°F | Wt 194.0 lb

## 2017-08-19 DIAGNOSIS — I10 Essential (primary) hypertension: Secondary | ICD-10-CM | POA: Diagnosis not present

## 2017-08-19 DIAGNOSIS — E1165 Type 2 diabetes mellitus with hyperglycemia: Secondary | ICD-10-CM | POA: Diagnosis not present

## 2017-08-19 LAB — GLUCOSE, POCT (MANUAL RESULT ENTRY): POC GLUCOSE: 226 mg/dL — AB (ref 70–99)

## 2017-08-19 MED ORDER — AMLODIPINE BESYLATE 5 MG PO TABS: 5.0000 mg | ORAL_TABLET | Freq: Every day | ORAL | 3 refills | Status: DC

## 2017-08-19 NOTE — Progress Notes (Signed)
Subjective:    Patient ID: Breanna Hall, female    DOB: 1961/06/09, 56 y.o.   MRN: [id number]  HPI  56 year old female who  has a past medical history of Bowel obstruction (HCC); Colon polyp; Diabetes mellitus; Endometriosis; Frozen shoulder; and Hypertension. She presents to the office today for follow up regarding hypertension. During her last visit the decision was made to increase lisinopril from 20 mg to 40 mg for hypertension. Her blood pressure at this visit was 166/104.   Today in the office she reports that she is not checking her blood sugars at home. In the office her blood pressure is 154/90  She does report that she is tolerated Invokana and Lipitor well. She is not checking her blood sugars at home.She is eating better and exercising more over the last two weeks   Review of Systems See HPI   Past Medical History:  Diagnosis Date  . Bowel obstruction (HCC)   . Colon polyp   . Diabetes mellitus   . Endometriosis   . Frozen shoulder   . Hypertension    high blood pressure readings    Social History   Social History  . Marital status: Single    Spouse name: N/A  . Number of children: N/A  . Years of education: N/A   Occupational History  . Not on file.   Social History Main Topics  . Smoking status: Never Smoker  . Smokeless tobacco: Never Used  . Alcohol use No  . Drug use: No  . Sexual activity: Not on file   Other Topics Concern  . 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 CLAVICLE EXCISION  2012  . COLECTOMY  Feb 2010   sigmoid  . TONSILLECTOMY AND ADENOIDECTOMY  1989  . VENTRAL HERNIA REPAIR  09-30-09    Family History  Problem Relation Age of Onset  . Breast cancer Mother 38       Smoker  . Elevated Lipids Mother   . Hypertension Mother   . Diabetes Mother   . Elevated Lipids Father   . Colon cancer  Maternal Grandmother   . Lung cancer Maternal Grandmother   . Lung cancer Maternal Grandfather        Smoker  . Brain cancer Maternal Grandfather   . Uterine cancer Paternal Grandmother   . Diabetes Paternal Grandfather     Allergies  Allergen Reactions  . Penicillins     Current Outpatient Prescriptions on File Prior to Visit  Medication Sig Dispense Refill  . atorvastatin (LIPITOR) 40 MG tablet Take 1 tablet (40 mg total) by mouth daily. 90 tablet 3  . canagliflozin (INVOKANA) 100 MG TABS tablet Take 1 tablet (100 mg total) by mouth daily before breakfast. 30 tablet 3  . gabapentin (NEURONTIN) 300 MG capsule TAKE 1 CAPSULE (300 MG TOTAL) BY MOUTH 2 (TWO) TIMES DAILY. 90 capsule 1  . glipiZIDE (GLUCOTROL) 10 MG tablet TAKE 1 TABLET BY MOUTH TWICE A DAY BEFORE A MEAL 180 tablet 1  . lisinopril (PRINIVIL,ZESTRIL) 40 MG tablet Take 1 tablet (40 mg total) by mouth daily. 90 tablet 1  . metFORMIN (GLUCOPHAGE) 1000 MG tablet TAKE 1 TABLET BY MOUTH TWICE A DAY WITH A MEAL 180 tablet 1   No current facility-administered medications on file prior to visit.  BP (!) 154/90 (BP Location: Left Arm)   Temp 98 F (36.7 C) (Oral)   Wt 194 lb (88 kg)   LMP 11/15/2010 (Approximate)   BMI 30.61 kg/m       Objective:   Physical Exam  Constitutional: She is oriented to person, place, and time. She appears well-developed and well-nourished. No distress.  Cardiovascular: Normal rate, regular rhythm, normal heart sounds and intact distal pulses.  Exam reveals no gallop.   No murmur heard. Pulmonary/Chest: Effort normal and breath sounds normal. No respiratory distress. She has no wheezes. She has no rales. She exhibits no tenderness.  Neurological: She is alert and oriented to person, place, and time.  Skin: Skin is warm and dry. No rash noted. She is not diaphoretic. No erythema. No pallor.  Psychiatric: She has a normal mood and affect. Her behavior is normal. Judgment and thought content  normal.  Vitals reviewed.     Assessment & Plan:  1. Essential hypertension - Not at goal. Will add Norvasc 5 mg  - amLODipine (NORVASC) 5 MG tablet; Take 1 tablet (5 mg total) by mouth daily.  Dispense: 30 tablet; Refill: 3 - Follow up with at next A1c check  2. Uncontrolled type 2 diabetes mellitus with hyperglycemia (HCC) - I again recommended Trulicity. She again, refused.  - Advised to monitor her blood sugars at home, she refuses to do this as well  - POC Glucose (CBG)- 226  - Follow up in November for A1c check   Shirline Frees, NP

## 2017-08-22 ENCOUNTER — Ambulatory Visit: Payer: 59 | Admitting: Adult Health

## 2017-10-14 ENCOUNTER — Ambulatory Visit: Payer: 59 | Admitting: Adult Health

## 2017-10-28 ENCOUNTER — Encounter: Payer: Self-pay | Admitting: Adult Health

## 2017-10-28 ENCOUNTER — Ambulatory Visit (INDEPENDENT_AMBULATORY_CARE_PROVIDER_SITE_OTHER): Payer: 59 | Admitting: Adult Health

## 2017-10-28 VITALS — BP 150/90 | Temp 98.2°F | Wt 194.0 lb

## 2017-10-28 DIAGNOSIS — I10 Essential (primary) hypertension: Secondary | ICD-10-CM | POA: Diagnosis not present

## 2017-10-28 DIAGNOSIS — E1165 Type 2 diabetes mellitus with hyperglycemia: Secondary | ICD-10-CM | POA: Diagnosis not present

## 2017-10-28 LAB — POCT GLYCOSYLATED HEMOGLOBIN (HGB A1C): HEMOGLOBIN A1C: 11.9

## 2017-10-28 MED ORDER — LISINOPRIL 40 MG PO TABS
40.0000 mg | ORAL_TABLET | Freq: Every day | ORAL | 1 refills | Status: DC
Start: 2017-10-28 — End: 2018-09-06

## 2017-10-28 MED ORDER — DULAGLUTIDE 0.75 MG/0.5ML ~~LOC~~ SOAJ: 0.7500 mg | SUBCUTANEOUS | 1 refills | Status: DC

## 2017-10-28 NOTE — Progress Notes (Signed)
Subjective:    Patient ID: Breanna SimmondsJan Rachelle Hall, female    DOB: 10-Jul-1961, 56 y.o.   MRN: [id number]  HPI   56 year old female who  has a past medical history of Bowel obstruction (HCC), Colon polyp, Diabetes mellitus, Endometriosis, Frozen shoulder, and Hypertension. She presents to the office today for follow up regarding diabetes and hypertension.   Currently her diabetes is controlled with Invokana 100 mg and Glipizide 10 mg. Her last A1c was 13.4 at which time she was started on Invokana. She refused to go on Trulicity and refused to see Endocrinology.  She refuses to continue to not check her blood sugars at home.  Reports trying to eat a heart healthy diet and reports that she lost 5 or 6 pounds but gained it back over the Thanksgiving holiday.  She is not exercising on a regular basis.  Weight continues to be 194 pounds.   Blood pressure is currently controlled with amlodipine 5 mg.  During her last visit she was started on lisinopril 40 mg failed to start lisinopril now she was supposed to take both at the same time.  Blood pressure continues to be elevated at 150/90.     Lab Results  Component Value Date   HGBA1C 11.9 10/28/2017     Review of Systems   See HPI  Past Medical History:  Diagnosis Date  . Bowel obstruction (HCC)   . Colon polyp   . Diabetes mellitus   . Endometriosis   . Frozen shoulder   . Hypertension    high blood pressure readings    Social History   Socioeconomic History  . Marital status: Single    Spouse name: Not on file  . Number of children: Not on file  . Years of education: Not on file  . Highest education level: Not on file  Social Needs  . Financial resource strain: Not on file  . Food insecurity - worry: Not on file  . Food insecurity - inability: Not on file  . Transportation needs - medical: Not on file  . Transportation needs - non-medical: Not on file  Occupational History  . Not on file  Tobacco Use  . Smoking status:  Never Smoker  . Smokeless tobacco: Never Used  Substance and Sexual Activity  . Alcohol use: No  . Drug use: No  . Sexual activity: Not on file  Other Topics Concern  . 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 CLAVICLE EXCISION  2012  . COLECTOMY  Feb 2010   sigmoid  . TONSILLECTOMY AND ADENOIDECTOMY  1989  . VENTRAL HERNIA REPAIR  09-30-09    Family History  Problem Relation Age of Onset  . Breast cancer Mother 8840       Smoker  . Elevated Lipids Mother   . Hypertension Mother   . Diabetes Mother   . Elevated Lipids Father   . Colon cancer Maternal Grandmother   . Lung cancer Maternal Grandmother   . Lung cancer Maternal Grandfather        Smoker  . Brain cancer Maternal Grandfather   . Uterine cancer Paternal Grandmother   . Diabetes Paternal Grandfather     Allergies  Allergen Reactions  . Penicillins     Current Outpatient Medications on File Prior to Visit  Medication Sig Dispense Refill  .  amLODipine (NORVASC) 5 MG tablet Take 1 tablet (5 mg total) by mouth daily. 30 tablet 3  . atorvastatin (LIPITOR) 40 MG tablet Take 1 tablet (40 mg total) by mouth daily. 90 tablet 3  . canagliflozin (INVOKANA) 100 MG TABS tablet Take 1 tablet (100 mg total) by mouth daily before breakfast. 30 tablet 3  . gabapentin (NEURONTIN) 300 MG capsule TAKE 1 CAPSULE (300 MG TOTAL) BY MOUTH 2 (TWO) TIMES DAILY. 90 capsule 1  . glipiZIDE (GLUCOTROL) 10 MG tablet TAKE 1 TABLET BY MOUTH TWICE A DAY BEFORE A MEAL 180 tablet 1  . metFORMIN (GLUCOPHAGE) 1000 MG tablet TAKE 1 TABLET BY MOUTH TWICE A DAY WITH A MEAL 180 tablet 1  . lisinopril (PRINIVIL,ZESTRIL) 40 MG tablet Take 1 tablet (40 mg total) by mouth daily. (Patient not taking: Reported on 10/28/2017) 90 tablet 1   No current facility-administered medications on file prior to visit.     BP (!) 150/90 (BP Location:  Left Arm)   Temp 98.2 F (36.8 C) (Oral)   Wt 194 lb (88 kg)   LMP 11/15/2010 (Approximate)   BMI 30.61 kg/m       Objective:   Physical Exam  Constitutional: She is oriented to person, place, and time. She appears well-developed and well-nourished. No distress.  Cardiovascular: Normal rate, regular rhythm, normal heart sounds and intact distal pulses. Exam reveals no gallop and no friction rub.  No murmur heard. Pulmonary/Chest: Effort normal and breath sounds normal. No respiratory distress. She has no wheezes. She has no rales.  Neurological: She is alert and oriented to person, place, and time.  Skin: Skin is warm and dry. No rash noted. She is not diaphoretic. No erythema. No pallor.  Psychiatric: She has a normal mood and affect. Her behavior is normal. Judgment and thought content normal.  Nursing note and vitals reviewed.     Assessment & Plan:  1. Uncontrolled type 2 diabetes mellitus with hyperglycemia (HCC) - She is ok with trying Trulicity at this time. She was given a sample box and shown how to use the syringe. She was able to demonstrate back to me the proper use and gave herself the first injection  - Dulaglutide (TRULICITY) 0.75 MG/0.5ML SOPN; Inject 0.75 mg into the skin once a week.  Dispense: 4 pen; Refill: 1 - POC HgB A1c- 11.9  - Encouraged regular exercise and a diabetic diet.  - Follow up in 3 months or sooner if needed  2. Essential hypertension - Educated that she needs to take both medications at the same time daily  - lisinopril (PRINIVIL,ZESTRIL) 40 MG tablet; Take 1 tablet (40 mg total) by mouth daily.  Dispense: 90 tablet; Refill: 1   Shirline Freesory Havish Petties, NP

## 2017-11-18 ENCOUNTER — Ambulatory Visit: Payer: 59 | Admitting: Adult Health

## 2017-12-03 ENCOUNTER — Other Ambulatory Visit: Payer: Self-pay | Admitting: Adult Health

## 2017-12-07 NOTE — Telephone Encounter (Signed)
Sent to the pharmacy by e-scribe for 90 days.  Pt has upcoming appt with Kandee Keenory on 02/03/18

## 2017-12-16 ENCOUNTER — Other Ambulatory Visit: Payer: Self-pay | Admitting: Adult Health

## 2017-12-16 DIAGNOSIS — I10 Essential (primary) hypertension: Secondary | ICD-10-CM

## 2017-12-16 NOTE — Telephone Encounter (Signed)
Medication denied. Refill request too soon.

## 2017-12-20 ENCOUNTER — Other Ambulatory Visit: Payer: Self-pay | Admitting: Adult Health

## 2017-12-20 DIAGNOSIS — I10 Essential (primary) hypertension: Secondary | ICD-10-CM

## 2017-12-21 NOTE — Telephone Encounter (Signed)
Sent to the pharmacy by e-scribe. 

## 2018-01-27 ENCOUNTER — Ambulatory Visit (INDEPENDENT_AMBULATORY_CARE_PROVIDER_SITE_OTHER): Payer: 59 | Admitting: Adult Health

## 2018-01-27 ENCOUNTER — Telehealth: Payer: Self-pay | Admitting: Adult Health

## 2018-01-27 ENCOUNTER — Encounter: Payer: Self-pay | Admitting: Adult Health

## 2018-01-27 VITALS — BP 130/80 | Temp 97.8°F | Wt 190.0 lb

## 2018-01-27 DIAGNOSIS — E1165 Type 2 diabetes mellitus with hyperglycemia: Secondary | ICD-10-CM

## 2018-01-27 DIAGNOSIS — E782 Mixed hyperlipidemia: Secondary | ICD-10-CM | POA: Diagnosis not present

## 2018-01-27 DIAGNOSIS — I10 Essential (primary) hypertension: Secondary | ICD-10-CM | POA: Diagnosis not present

## 2018-01-27 LAB — LIPID PANEL
CHOL/HDL RATIO: 3
Cholesterol: 171 mg/dL (ref 0–200)
HDL: 50.6 mg/dL (ref >39.00)
NONHDL: 119.97
TRIGLYCERIDES: 284 mg/dL — AB (ref 0.0–149.0)
VLDL: 56.8 mg/dL — ABNORMAL HIGH (ref 0.0–40.0)

## 2018-01-27 LAB — BASIC METABOLIC PANEL
BUN: 15 mg/dL (ref 6–23)
CALCIUM: 9.9 mg/dL (ref 8.4–10.5)
CO2: 28 meq/L (ref 19–32)
CREATININE: 0.57 mg/dL (ref 0.40–1.20)
Chloride: 102 mEq/L (ref 96–112)
GFR: 116.13 mL/min (ref >60.00)
Glucose, Bld: 218 mg/dL — ABNORMAL HIGH (ref 70–99)
Potassium: 4.6 mEq/L (ref 3.5–5.1)
SODIUM: 140 meq/L (ref 135–145)

## 2018-01-27 LAB — LDL CHOLESTEROL, DIRECT: LDL DIRECT: 88 mg/dL

## 2018-01-27 LAB — HEMOGLOBIN A1C: Hgb A1c MFr Bld: 10.4 % — ABNORMAL HIGH (ref 4.6–6.5)

## 2018-01-27 MED ORDER — DULAGLUTIDE 1.5 MG/0.5ML ~~LOC~~ SOAJ: 1.5000 mg | SUBCUTANEOUS | 2 refills | Status: DC

## 2018-01-27 NOTE — Patient Instructions (Signed)
I will follow up with you regarding your blood work   I will see you in 3 months or sooner if needed  Continue to work hard. I am proud of you!

## 2018-01-27 NOTE — Telephone Encounter (Signed)
Spoke to patient and informed of her labs   Her cholesterol panel has improved greatly.   Her A1c has dropped about about a point. Will increase Trulicity to 1.5 mg   Follow up in 3 months

## 2018-01-27 NOTE — Progress Notes (Signed)
Subjective:    Patient ID: Breanna Hall, female    DOB: 1961-03-23, 57 y.o.   MRN: [id number]  HPI  57 year old female who  has a past medical history of Bowel obstruction ([place]), Colon polyp, Diabetes mellitus, Endometriosis, Frozen shoulder, and Hypertension.  She presents to the office today for follow up regarding hypertension, hyperlipidemia  and uncontrolled DM II.   She is currently prescribed Trulicity 8.67 mg, Invokana 100 mg, glipizide 10 mg, and metformin 1000 mg BID.  During her last visit she was placed on Trulicity for continued elevated A1c. Today in the office she reports that she has been eating healthier and has been staying active with redoing her beach house in order to get it ready to rent for the summer. She denies any side effects   Lab Results  Component Value Date   HGBA1C 11.9 10/28/2017   She takes lisinopril 40 mg and norvasc 5 mg  due to history of hypertension BP Readings from Last 3 Encounters:  01/27/18 130/80  10/28/17 (!) 150/90  08/19/17 (!) 154/90   She was started on Lipitor 40 mg for hyperlipidemia  Lab Results  Component Value Date   CHOL 345 (H) 08/09/2017   HDL 51.50 08/09/2017   LDLDIRECT 168.0 08/09/2017   TRIG (H) 08/09/2017    696.0 Triglyceride is over 400; calculations on Lipids are invalid.   CHOLHDL 7 08/09/2017     Review of Systems  Constitutional: Negative.   Respiratory: Negative.   Cardiovascular: Negative.   Skin: Negative.   Allergic/Immunologic: Negative.   Neurological: Negative.   Hematological: Negative.    Past Medical History:  Diagnosis Date  . Bowel obstruction ([place])   . Colon polyp   . Diabetes mellitus   . Endometriosis   . Frozen shoulder   . Hypertension    high blood pressure readings    Social History   Socioeconomic History  . Marital status: Single    Spouse name: Not on file  . Number of children: Not on file  . Years of education: Not on file  . Highest education level: Not  on file  Social Needs  . Financial resource strain: Not on file  . Food insecurity - worry: Not on file  . Food insecurity - inability: Not on file  . Transportation needs - medical: Not on file  . Transportation needs - non-medical: Not on file  Occupational History  . Not on file  Tobacco Use  . Smoking status: Never Smoker  . Smokeless tobacco: Never Used  Substance and Sexual Activity  . Alcohol use: No  . Drug use: No  . Sexual activity: Not on file  Other Topics Concern  . 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 CLAVICLE EXCISION  2012  . COLECTOMY  Feb 2010   sigmoid  . TONSILLECTOMY AND ADENOIDECTOMY  1989  . VENTRAL HERNIA REPAIR  09-30-09    Family History  Problem Relation Age of Onset  . Breast cancer Mother 59       Smoker  . Elevated Lipids Mother   . Hypertension Mother   . Diabetes Mother   . Elevated Lipids Father   . Colon cancer Maternal Grandmother   . Lung cancer Maternal Grandmother   . Lung cancer Maternal Grandfather  Smoker  . Brain cancer Maternal Grandfather   . Uterine cancer Paternal Grandmother   . Diabetes Paternal Grandfather     Allergies  Allergen Reactions  . Penicillins     Current Outpatient Medications on File Prior to Visit  Medication Sig Dispense Refill  . amLODipine (NORVASC) 5 MG tablet TAKE 1 TABLET BY MOUTH EVERY DAY 90 tablet 2  . atorvastatin (LIPITOR) 40 MG tablet Take 1 tablet (40 mg total) by mouth daily. 90 tablet 3  . Dulaglutide (TRULICITY) 1.03 PR/9.4VO SOPN Inject 0.75 mg into the skin once a week. 4 pen 1  . glipiZIDE (GLUCOTROL) 10 MG tablet TAKE 1 TABLET BY MOUTH TWICE A DAY BEFORE A MEAL 180 tablet 1  . INVOKANA 100 MG TABS tablet TAKE 1 TABLET (100 MG TOTAL) BY MOUTH DAILY BEFORE BREAKFAST. 90 tablet 0  . lisinopril (PRINIVIL,ZESTRIL) 40 MG tablet Take 1 tablet (40 mg total) by  mouth daily. 90 tablet 1  . metFORMIN (GLUCOPHAGE) 1000 MG tablet TAKE 1 TABLET BY MOUTH TWICE A DAY WITH A MEAL 180 tablet 1  . gabapentin (NEURONTIN) 300 MG capsule TAKE 1 CAPSULE (300 MG TOTAL) BY MOUTH 2 (TWO) TIMES DAILY. (Patient not taking: Reported on 01/27/2018) 90 capsule 1   No current facility-administered medications on file prior to visit.     BP 130/80 (BP Location: Left Arm, Patient Position: Sitting, Cuff Size: Large)   Temp 97.8 F (36.6 C) (Oral)   Wt 190 lb (86.2 kg)   LMP 11/15/2010 (Approximate)   BMI 29.98 kg/m       Objective:   Physical Exam  Constitutional: She is oriented to person, place, and time. She appears well-developed and well-nourished. No distress.  HENT:  Head: Normocephalic and atraumatic.  Right Ear: External ear normal.  Left Ear: External ear normal.  Nose: Nose normal.  Mouth/Throat: Oropharynx is clear and moist. No oropharyngeal exudate.  Eyes: Conjunctivae and EOM are normal. Pupils are equal, round, and reactive to light. Right eye exhibits no discharge. Left eye exhibits no discharge. No scleral icterus.  Cardiovascular: Normal rate, regular rhythm, normal heart sounds and intact distal pulses. Exam reveals no gallop and no friction rub.  No murmur heard. Pulmonary/Chest: Effort normal and breath sounds normal. No respiratory distress. She has no wheezes. She has no rales. She exhibits no tenderness.  Musculoskeletal: Normal range of motion. She exhibits no edema, tenderness or deformity.  Neurological: She is alert and oriented to person, place, and time. She has normal reflexes. She displays normal reflexes. No cranial nerve deficit. She exhibits normal muscle tone. Coordination normal.  Skin: Skin is warm and dry. No rash noted. She is not diaphoretic. No erythema. No pallor.  Psychiatric: She has a normal mood and affect. Her behavior is normal. Judgment and thought content normal.  Nursing note and vitals reviewed.       Assessment & Plan:  1. Uncontrolled type 2 diabetes mellitus with hyperglycemia ([place]) - Consider increasing dose of Trulicity.  - Encouraged to continue to work on diabetic diet and exercise.  - Hemoglobin A1c - BMP with eGFR([facility]) - Follow up in three months   2. Mixed hyperlipidemia - Consider adding Fenofibrate  - Lipid panel  3. Essential hypertension - Has improved. No change in medication at this time  - BMP with eGFR([facility])  Dorothyann Peng, NP

## 2018-02-03 ENCOUNTER — Ambulatory Visit: Payer: 59 | Admitting: Adult Health

## 2018-02-27 ENCOUNTER — Other Ambulatory Visit: Payer: Self-pay | Admitting: Adult Health

## 2018-02-27 DIAGNOSIS — E1165 Type 2 diabetes mellitus with hyperglycemia: Secondary | ICD-10-CM

## 2018-02-27 DIAGNOSIS — IMO0001 Reserved for inherently not codable concepts without codable children: Secondary | ICD-10-CM

## 2018-02-28 NOTE — Telephone Encounter (Signed)
Glipizide sent to the pharmacy for 3 months.  Trulicity filled in March with refills.  Message sent to the pharmacy to check file.

## 2018-03-02 ENCOUNTER — Other Ambulatory Visit: Payer: Self-pay | Admitting: Adult Health

## 2018-03-02 NOTE — Telephone Encounter (Signed)
Sent to the pharmacy by e-scribe. 

## 2018-03-26 ENCOUNTER — Other Ambulatory Visit: Payer: Self-pay | Admitting: Adult Health

## 2018-03-28 NOTE — Telephone Encounter (Signed)
Sent to the pharmacy by e-scribe. 

## 2018-04-24 ENCOUNTER — Other Ambulatory Visit: Payer: Self-pay | Admitting: Adult Health

## 2018-04-24 DIAGNOSIS — E1165 Type 2 diabetes mellitus with hyperglycemia: Secondary | ICD-10-CM

## 2018-04-25 NOTE — Telephone Encounter (Addendum)
Left a message for a return call.  Needs A1C follow up.  CRM created.

## 2018-04-25 NOTE — Telephone Encounter (Signed)
Pt called back and scheduled with [facility] on 06/16/18.  Should she be seen sooner?

## 2018-04-26 NOTE — Telephone Encounter (Signed)
Ok to fill until next visit

## 2018-04-26 NOTE — Telephone Encounter (Signed)
Sent to the pharmacy by e-scribe. 

## 2018-06-03 ENCOUNTER — Other Ambulatory Visit: Payer: Self-pay | Admitting: Adult Health

## 2018-06-03 DIAGNOSIS — IMO0001 Reserved for inherently not codable concepts without codable children: Secondary | ICD-10-CM

## 2018-06-03 DIAGNOSIS — E1165 Type 2 diabetes mellitus with hyperglycemia: Principal | ICD-10-CM

## 2018-06-16 ENCOUNTER — Ambulatory Visit: Payer: 59 | Admitting: Adult Health

## 2018-07-04 ENCOUNTER — Other Ambulatory Visit: Payer: Self-pay | Admitting: Adult Health

## 2018-07-11 ENCOUNTER — Ambulatory Visit (INDEPENDENT_AMBULATORY_CARE_PROVIDER_SITE_OTHER): Payer: 59 | Admitting: Adult Health

## 2018-07-11 ENCOUNTER — Telehealth: Payer: Self-pay | Admitting: Adult Health

## 2018-07-11 ENCOUNTER — Encounter: Payer: Self-pay | Admitting: Adult Health

## 2018-07-11 VITALS — BP 140/90 | Temp 97.8°F | Wt 190.0 lb

## 2018-07-11 DIAGNOSIS — E1165 Type 2 diabetes mellitus with hyperglycemia: Secondary | ICD-10-CM

## 2018-07-11 DIAGNOSIS — E785 Hyperlipidemia, unspecified: Secondary | ICD-10-CM | POA: Insufficient documentation

## 2018-07-11 DIAGNOSIS — E782 Mixed hyperlipidemia: Secondary | ICD-10-CM

## 2018-07-11 LAB — BASIC METABOLIC PANEL
BUN: 16 mg/dL (ref 6–23)
CO2: 29 meq/L (ref 19–32)
Calcium: 9.9 mg/dL (ref 8.4–10.5)
Chloride: 102 mEq/L (ref 96–112)
Creatinine, Ser: 0.7 mg/dL (ref 0.40–1.20)
GFR: 91.47 mL/min (ref >60.00)
GLUCOSE: 305 mg/dL — AB (ref 70–99)
POTASSIUM: 4.9 meq/L (ref 3.5–5.1)
Sodium: 138 mEq/L (ref 135–145)

## 2018-07-11 LAB — LIPID PANEL
CHOL/HDL RATIO: 3
CHOLESTEROL: 164 mg/dL (ref 0–200)
HDL: 48.3 mg/dL (ref >39.00)
NonHDL: 115.68
TRIGLYCERIDES: 223 mg/dL — AB (ref 0.0–149.0)
VLDL: 44.6 mg/dL — AB (ref 0.0–40.0)

## 2018-07-11 LAB — HEPATIC FUNCTION PANEL
ALBUMIN: 4.2 g/dL (ref 3.5–5.2)
ALK PHOS: 93 U/L (ref 39–117)
ALT: 12 U/L (ref 0–35)
AST: 9 U/L (ref 0–37)
BILIRUBIN DIRECT: 0.1 mg/dL (ref 0.0–0.3)
TOTAL PROTEIN: 7 g/dL (ref 6.0–8.3)
Total Bilirubin: 0.5 mg/dL (ref 0.2–1.2)

## 2018-07-11 LAB — HEMOGLOBIN A1C: Hgb A1c MFr Bld: 10.1 % — ABNORMAL HIGH (ref 4.6–6.5)

## 2018-07-11 LAB — LDL CHOLESTEROL, DIRECT: Direct LDL: 87 mg/dL

## 2018-07-11 MED ORDER — CANAGLIFLOZIN 300 MG PO TABS: 300.0000 mg | ORAL_TABLET | Freq: Every day | ORAL | 0 refills | Status: AC

## 2018-07-11 MED ORDER — DULAGLUTIDE 0.75 MG/0.5ML ~~LOC~~ SOAJ: 0.7500 mg | SUBCUTANEOUS | 2 refills | Status: DC

## 2018-07-11 MED ORDER — CANAGLIFLOZIN 100 MG PO TABS: 300.0000 mg | ORAL_TABLET | Freq: Every day | ORAL | 0 refills | Status: DC

## 2018-07-11 NOTE — Telephone Encounter (Signed)
Reviewed lab work with patient.   Her cholesterol panel has improved slightly so we will keep her on current dose of lipitor. She does not want to go on additional medication   Will take her back down to 0.75 of Trulicity d/t side effects and increase Invokana to 300 mg daily.   She does not want to go on insulin at this time nor see endocrinology.

## 2018-07-11 NOTE — Progress Notes (Signed)
Subjective:    Patient ID: Breanna Hall, female    DOB: 17-Aug-1961, 57 y.o.   MRN: [id number]  HPI 57 year old female who  has a past medical history of Bowel obstruction (HCC), Colon polyp, Diabetes mellitus, Endometriosis, Frozen shoulder, and Hypertension.   She presents to the office today for follow up regarding diabetes and hyperlipidemia.   She is currently prescribed Trulicity 1.5 mg, Invokana 100 mg, glipizide 10 mg and Metformin 1000 mg BID. Her last A1c was 10.4, she has been slowly coming down from a high of 13.7. She has been very resistant in the past to go on medications ( especially insulin. She refuses to check her blood sugar at home.   Lab Results  Component Value Date   HGBA1C 10.4 (H) 01/27/2018   She reports that the increase in Trulicity has caused nausea and vomiting about two days after the injection. She did fine on 0.75 mg. She is taking other medication as directed.   She has been eating healthy and exercise on occasion.   She denies any complaints on Lipitor.  Lab Results  Component Value Date   CHOL 171 01/27/2018   CHOL 345 (H) 08/09/2017   CHOL 236 (HH) 01/27/2009   Lab Results  Component Value Date   HDL 50.60 01/27/2018   HDL 51.50 08/09/2017   HDL 40.7 01/27/2009   No results found for: [facility] Lab Results  Component Value Date   TRIG 284.0 (H) 01/27/2018   TRIG (H) 08/09/2017    696.0 Triglyceride is over 400; calculations on Lipids are invalid.   TRIG 184 (H) 01/27/2009   Lab Results  Component Value Date   CHOLHDL 3 01/27/2018   CHOLHDL 7 08/09/2017   CHOLHDL 5.8 CALC 01/27/2009   Lab Results  Component Value Date   LDLDIRECT 88.0 01/27/2018   LDLDIRECT 168.0 08/09/2017   LDLDIRECT 168.6 01/27/2009    Wt Readings from Last 3 Encounters:  07/11/18 190 lb (86.2 kg)  01/27/18 190 lb (86.2 kg)  10/28/17 194 lb (88 kg)   Review of Systems  Constitutional: Negative.   Respiratory: Negative.   Cardiovascular:  Negative.   Gastrointestinal: Positive for nausea and vomiting.  Genitourinary: Negative.   Neurological: Negative.   Hematological: Negative.   All other systems reviewed and are negative.  Past Medical History:  Diagnosis Date  . Bowel obstruction (HCC)   . Colon polyp   . Diabetes mellitus   . Endometriosis   . Frozen shoulder   . Hypertension    high blood pressure readings    Social History   Socioeconomic History  . Marital status: Single    Spouse name: Not on file  . Number of children: Not on file  . Years of education: Not on file  . Highest education level: Not on file  Occupational History  . Not on file  Social Needs  . Financial resource strain: Not on file  . Food insecurity:    Worry: Not on file    Inability: Not on file  . Transportation needs:    Medical: Not on file    Non-medical: Not on file  Tobacco Use  . Smoking status: Never Smoker  . Smokeless tobacco: Never Used  Substance and Sexual Activity  . Alcohol use: No  . Drug use: No  . Sexual activity: Not on file  Lifestyle  . Physical activity:    Days per week: Not on file    Minutes per session: Not  on file  . Stress: Not on file  Relationships  . Social connections:    Talks on phone: Not on file    Gets together: Not on file    Attends religious service: Not on file    Active member of club or organization: Not on file    Attends meetings of clubs or organizations: Not on file    Relationship status: Not on file  . Intimate partner violence:    Fear of current or ex partner: Not on file    Emotionally abused: Not on file    Physically abused: Not on file    Forced sexual activity: Not on file  Other Topics Concern  . 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 CLAVICLE EXCISION  2012  . COLECTOMY  Feb 2010   sigmoid  . TONSILLECTOMY AND ADENOIDECTOMY  1989    . VENTRAL HERNIA REPAIR  09-30-09    Family History  Problem Relation Age of Onset  . Breast cancer Mother 80       Smoker  . Elevated Lipids Mother   . Hypertension Mother   . Diabetes Mother   . Elevated Lipids Father   . Colon cancer Maternal Grandmother   . Lung cancer Maternal Grandmother   . Lung cancer Maternal Grandfather        Smoker  . Brain cancer Maternal Grandfather   . Uterine cancer Paternal Grandmother   . Diabetes Paternal Grandfather     Allergies  Allergen Reactions  . Penicillins     Current Outpatient Medications on File Prior to Visit  Medication Sig Dispense Refill  . amLODipine (NORVASC) 5 MG tablet TAKE 1 TABLET BY MOUTH EVERY DAY 90 tablet 2  . atorvastatin (LIPITOR) 40 MG tablet Take 1 tablet (40 mg total) by mouth daily. 90 tablet 3  . Dulaglutide (TRULICITY) 1.5 MG/0.5ML SOPN Inject 1.5 mg into the skin once a week. 4 pen 2  . gabapentin (NEURONTIN) 300 MG capsule TAKE 1 CAPSULE (300 MG TOTAL) BY MOUTH 2 (TWO) TIMES DAILY. 90 capsule 1  . glipiZIDE (GLUCOTROL) 10 MG tablet TAKE 1 TABLET BY MOUTH TWICE A DAY BEFORE A MEAL 60 tablet 2  . INVOKANA 100 MG TABS tablet TAKE 1 TABLET (100 MG TOTAL) BY MOUTH DAILY BEFORE BREAKFAST. 30 tablet 2  . lisinopril (PRINIVIL,ZESTRIL) 40 MG tablet Take 1 tablet (40 mg total) by mouth daily. 90 tablet 1  . metFORMIN (GLUCOPHAGE) 1000 MG tablet TAKE 1 TABLET BY MOUTH TWICE A DAY WITH A MEAL 60 tablet 2   No current facility-administered medications on file prior to visit.     BP 140/90   Temp 97.8 F (36.6 C) (Oral)   Wt 190 lb (86.2 kg)   LMP 11/15/2010 (Approximate)   BMI 29.98 kg/m       Objective:   Physical Exam  Constitutional: She appears well-developed and well-nourished. No distress.  Cardiovascular: Normal rate, regular rhythm, normal heart sounds and intact distal pulses.  Pulmonary/Chest: Effort normal and breath sounds normal.  Abdominal: Soft. Bowel sounds are normal. She exhibits no  distension. There is no tenderness.  Skin: Skin is warm and dry. She is not diaphoretic.  Psychiatric: She has a normal mood and affect. Her behavior is normal. Thought content normal.  Nursing note and vitals reviewed.     Assessment &  Plan:  1. Uncontrolled type 2 diabetes mellitus with hyperglycemia (HCC) - Likely decrease trulicity to 0.75 mg and increase Invokana to 300 mg daily.  - Encouraged to exercise more often and eat healthy  - Follow up in 3 months for CPE  - Basic metabolic panel - Hemoglobin A1c - Hepatic function panel - Lipid panel  2. Mixed hyperlipidemia - Consider increase in lipitor  - Basic metabolic panel - Hemoglobin A1c - Hepatic function panel - Lipid panel  Shirline Freesory Scotty Pinder, NP

## 2018-07-12 ENCOUNTER — Ambulatory Visit: Payer: 59 | Admitting: Adult Health

## 2018-07-31 ENCOUNTER — Other Ambulatory Visit: Payer: Self-pay | Admitting: Adult Health

## 2018-07-31 DIAGNOSIS — E782 Mixed hyperlipidemia: Secondary | ICD-10-CM

## 2018-08-01 NOTE — Telephone Encounter (Signed)
[name], you mentioned a possible increase in medication at last visit.  Last lipid done on 07/11/18.  Please advise.

## 2018-08-27 ENCOUNTER — Other Ambulatory Visit: Payer: Self-pay | Admitting: Adult Health

## 2018-08-27 DIAGNOSIS — I10 Essential (primary) hypertension: Secondary | ICD-10-CM

## 2018-08-29 NOTE — Telephone Encounter (Signed)
Sent to the pharmacy by e-scribe for 90 days.  Pt has upcoming cpx on 10/27/18.

## 2018-08-30 ENCOUNTER — Telehealth: Payer: Self-pay | Admitting: Adult Health

## 2018-08-30 NOTE — Telephone Encounter (Signed)
Patient called and advised requested medication was sent to the pharmacy on 08/29/18, she verbalized understanding.

## 2018-08-30 NOTE — Telephone Encounter (Signed)
Copied from CRM [id number]. Topic: Quick Communication - Rx Refill/Question >> Aug 30, 2018  9:59 AM Burchel, Abbi R wrote: Medication: amLODipine (NORVASC) 5 MG tablet  [phone] [place],  - [address] [place] [address] Phone: [phone] Fax: [phone]    Pt was advised that RX refills may take up to 3 business days.

## 2018-09-03 ENCOUNTER — Other Ambulatory Visit: Payer: Self-pay | Admitting: Adult Health

## 2018-09-03 DIAGNOSIS — E1165 Type 2 diabetes mellitus with hyperglycemia: Principal | ICD-10-CM

## 2018-09-03 DIAGNOSIS — IMO0001 Reserved for inherently not codable concepts without codable children: Secondary | ICD-10-CM

## 2018-09-05 NOTE — Telephone Encounter (Signed)
Sent to the pharmacy by e-scribe.  Pt has scheduled appt in Dec for follow up A1C

## 2018-09-06 ENCOUNTER — Other Ambulatory Visit: Payer: Self-pay | Admitting: Adult Health

## 2018-09-06 DIAGNOSIS — I10 Essential (primary) hypertension: Secondary | ICD-10-CM

## 2018-09-06 NOTE — Telephone Encounter (Signed)
Sent to the pharmacy by e-scribe. 

## 2018-10-03 ENCOUNTER — Other Ambulatory Visit: Payer: Self-pay | Admitting: Adult Health

## 2018-10-03 DIAGNOSIS — IMO0001 Reserved for inherently not codable concepts without codable children: Secondary | ICD-10-CM

## 2018-10-03 DIAGNOSIS — E1165 Type 2 diabetes mellitus with hyperglycemia: Principal | ICD-10-CM

## 2018-10-04 ENCOUNTER — Other Ambulatory Visit: Payer: Self-pay | Admitting: Adult Health

## 2018-10-04 NOTE — Telephone Encounter (Signed)
Filled on 09/05/18 for 2 months.  Request is early.  Denied.

## 2018-10-04 NOTE — Telephone Encounter (Signed)
Sent to the pharmacy by e-scribe for 30 days.  Pt is scheduled for cpx on 10/27/18.

## 2018-10-23 ENCOUNTER — Telehealth: Payer: Self-pay | Admitting: Adult Health

## 2018-10-23 NOTE — Telephone Encounter (Signed)
Copied from CRM [phone]. Topic: Quick Communication - Rx Refill/Question >> Oct 23, 2018 10:30 AM Floria RavelingStovall, Shana A wrote: Medication:  Medication INVOKANA 100 MG TABS tablet [[id number]][[id number]] DISCONTINUED -  Pt stated she out of them med and [facility].  [place] to make it to Friday.  She stated she is now on 300 mg but I did not see it on her med list.  -   Has the patient contacted their pharmacy? No  (Agent: If no, request that the patient contact the pharmacy for the refill.) (Agent: If yes, when and what did the pharmacy advise?)  Preferred Pharmacy (with phone number or street name[facility] [place] [place] [phone] Rx # [id number]  Agent: Please be advised that RX refills may take up to 3 business days. We ask that you follow-up with your pharmacy.

## 2018-10-24 MED ORDER — CANAGLIFLOZIN 300 MG PO TABS: 300.0000 mg | ORAL_TABLET | Freq: Every day | ORAL | 0 refills | Status: DC

## 2018-10-24 NOTE — Telephone Encounter (Signed)
Sent to the pharmacy by e-scribe. 

## 2018-10-27 ENCOUNTER — Ambulatory Visit (INDEPENDENT_AMBULATORY_CARE_PROVIDER_SITE_OTHER): Payer: BLUE CROSS/BLUE SHIELD | Admitting: Adult Health

## 2018-10-27 ENCOUNTER — Other Ambulatory Visit: Payer: Self-pay | Admitting: Adult Health

## 2018-10-27 ENCOUNTER — Encounter: Payer: Self-pay | Admitting: Adult Health

## 2018-10-27 VITALS — BP 140/88 | Temp 97.8°F | Ht 67.5 in | Wt 190.0 lb

## 2018-10-27 DIAGNOSIS — I10 Essential (primary) hypertension: Secondary | ICD-10-CM | POA: Diagnosis not present

## 2018-10-27 DIAGNOSIS — Z1159 Encounter for screening for other viral diseases: Secondary | ICD-10-CM | POA: Diagnosis not present

## 2018-10-27 DIAGNOSIS — G629 Polyneuropathy, unspecified: Secondary | ICD-10-CM

## 2018-10-27 DIAGNOSIS — Z114 Encounter for screening for human immunodeficiency virus [HIV]: Secondary | ICD-10-CM

## 2018-10-27 DIAGNOSIS — Z0000 Encounter for general adult medical examination without abnormal findings: Secondary | ICD-10-CM

## 2018-10-27 DIAGNOSIS — F419 Anxiety disorder, unspecified: Secondary | ICD-10-CM

## 2018-10-27 DIAGNOSIS — Z23 Encounter for immunization: Secondary | ICD-10-CM | POA: Diagnosis not present

## 2018-10-27 DIAGNOSIS — E1140 Type 2 diabetes mellitus with diabetic neuropathy, unspecified: Secondary | ICD-10-CM

## 2018-10-27 DIAGNOSIS — E782 Mixed hyperlipidemia: Secondary | ICD-10-CM | POA: Diagnosis not present

## 2018-10-27 DIAGNOSIS — Z1211 Encounter for screening for malignant neoplasm of colon: Secondary | ICD-10-CM

## 2018-10-27 LAB — COMPREHENSIVE METABOLIC PANEL
ALT: 11 U/L (ref 0–35)
AST: 10 U/L (ref 0–37)
Albumin: 4.5 g/dL (ref 3.5–5.2)
Alkaline Phosphatase: 82 U/L (ref 39–117)
BUN: 15 mg/dL (ref 6–23)
CO2: 26 mEq/L (ref 19–32)
Calcium: 9.6 mg/dL (ref 8.4–10.5)
Chloride: 101 mEq/L (ref 96–112)
Creatinine, Ser: 0.52 mg/dL (ref 0.40–1.20)
GFR: 128.77 mL/min (ref >60.00)
Glucose, Bld: 197 mg/dL — ABNORMAL HIGH (ref 70–99)
Potassium: 4.3 mEq/L (ref 3.5–5.1)
SODIUM: 138 meq/L (ref 135–145)
TOTAL PROTEIN: 7 g/dL (ref 6.0–8.3)
Total Bilirubin: 0.5 mg/dL (ref 0.2–1.2)

## 2018-10-27 LAB — LIPID PANEL
Cholesterol: 160 mg/dL (ref 0–200)
HDL: 58.2 mg/dL (ref >39.00)
LDL Cholesterol: 71 mg/dL (ref 0–99)
NONHDL: 102.12
Total CHOL/HDL Ratio: 3
Triglycerides: 157 mg/dL — ABNORMAL HIGH (ref 0.0–149.0)
VLDL: 31.4 mg/dL (ref 0.0–40.0)

## 2018-10-27 LAB — CBC WITH DIFFERENTIAL/PLATELET
BASOS ABS: 0.1 10*3/uL (ref 0.0–0.1)
Basophils Relative: 0.6 % (ref 0.0–3.0)
Eosinophils Absolute: 0.3 10*3/uL (ref 0.0–0.7)
Eosinophils Relative: 2.5 % (ref 0.0–5.0)
HCT: 44.1 % (ref 36.0–46.0)
Hemoglobin: 14.7 g/dL (ref 12.0–15.0)
Lymphocytes Relative: 31 % (ref 12.0–46.0)
Lymphs Abs: 3.2 10*3/uL (ref 0.7–4.0)
MCHC: 33.4 g/dL (ref 30.0–36.0)
MCV: 80.1 fl (ref 78.0–100.0)
Monocytes Absolute: 0.7 10*3/uL (ref 0.1–1.0)
Monocytes Relative: 6.4 % (ref 3.0–12.0)
Neutro Abs: 6.1 10*3/uL (ref 1.4–7.7)
Neutrophils Relative %: 59.5 % (ref 43.0–77.0)
Platelets: 368 10*3/uL (ref 150.0–400.0)
RBC: 5.5 Mil/uL — AB (ref 3.87–5.11)
RDW: 13.8 % (ref 11.5–15.5)
WBC: 10.3 10*3/uL (ref 4.0–10.5)

## 2018-10-27 LAB — HEMOGLOBIN A1C: Hgb A1c MFr Bld: 8.7 % — ABNORMAL HIGH (ref 4.6–6.5)

## 2018-10-27 LAB — TSH: TSH: 1.13 u[IU]/mL (ref 0.35–4.50)

## 2018-10-27 MED ORDER — CANAGLIFLOZIN 300 MG PO TABS: 300.0000 mg | ORAL_TABLET | Freq: Every day | ORAL | 1 refills | Status: AC

## 2018-10-27 MED ORDER — METFORMIN HCL 1000 MG PO TABS: 1000.0000 mg | ORAL_TABLET | Freq: Two times a day (BID) | ORAL | 1 refills | Status: DC

## 2018-10-27 MED ORDER — DULAGLUTIDE 0.75 MG/0.5ML ~~LOC~~ SOAJ: 0.7500 mg | SUBCUTANEOUS | 2 refills | Status: DC

## 2018-10-27 MED ORDER — GLIPIZIDE 10 MG PO TABS: 10.0000 mg | ORAL_TABLET | Freq: Two times a day (BID) | ORAL | 1 refills | Status: DC

## 2018-10-27 MED ORDER — ALPRAZOLAM 0.5 MG PO TABS: ORAL_TABLET | ORAL | 0 refills | Status: DC

## 2018-10-27 MED ORDER — AMLODIPINE BESYLATE 10 MG PO TABS: 10.0000 mg | ORAL_TABLET | Freq: Every day | ORAL | 3 refills | Status: DC

## 2018-10-27 NOTE — Patient Instructions (Signed)
It was great seeing you today   We will follow up with you regarding your blood work   Please work on diet and exercise  All medications have been sent to the pharmacy   Someone from Dr. Haywood PaoHung's office will call you to schedule your colonoscopy.  Get an eye exam!

## 2018-10-27 NOTE — Addendum Note (Signed)
Addended by: Raj JanusADKINS, MISTY T on: 10/27/2018 08:46 AM   Modules accepted: Orders

## 2018-10-27 NOTE — Progress Notes (Signed)
Subjective:    Patient ID: Breanna Hall, female    DOB: Nov 08, 1961, 57 y.o.   MRN: [id number]  HPI  Patient presents for yearly preventative medicine examination. She is a pleasant 57 year old female who  has a past medical history of Bowel obstruction (HCC), Colon polyp, Diabetes mellitus, Endometriosis, Frozen shoulder, and Hypertension.  DM -she is currently prescribed Trulicity 0.75 mg, Invokana 300 mg, glipizide 10 mg BID, and metformin 1000 mg twice daily.  Last A1c was 10.1 in August 2019.  She has been slowly coming down from a high of 13.7.  In the past she has been very resistant to go on it medications, especially insulin and she refuses to check her blood sugars at home.  Prior to her last visit we had tried increasing Trulicity to 1.5 mg but she endorsed having nausea and vomiting soon after taking the injection.  Lab Results  Component Value Date   HGBA1C 10.1 (H) 07/11/2018   Hyperlipidemia - takes Lipitor 40 mg. She denies myalgias  Lab Results  Component Value Date   CHOL 164 07/11/2018   HDL 48.30 07/11/2018   LDLDIRECT 87.0 07/11/2018   TRIG 223.0 (H) 07/11/2018   CHOLHDL 3 07/11/2018   Essential Hypertension - takes Norvasc 5 mg BP Readings from Last 3 Encounters:  10/27/18 140/88  07/11/18 140/90  01/27/18 130/80    All immunizations and health maintenance protocols were reviewed with the patient and needed orders were placed. She is due for tetanus, influenza, and Pneumovax.   Appropriate screening laboratory values were ordered for the patient including screening of hyperlipidemia, renal function and hepatic function.  Medication reconciliation,  past medical history, social history, problem list and allergies were reviewed in detail with the patient  Goals were established with regard to weight loss, exercise, and  diet in compliance with medications. She has not been exercising nor eating healthy   Wt Readings from Last 3 Encounters:    10/27/18 190 lb (86.2 kg)  07/11/18 190 lb (86.2 kg)  01/27/18 190 lb (86.2 kg)    She is up to date on routine mammograms. She is due for colonoscopy and diabetic eye exam.   [facility] She would like something to calm her down before the surgery   Review of Systems  Constitutional: Negative.   HENT: Negative.   Eyes: Negative.   Respiratory: Negative.   Cardiovascular: Negative.   Gastrointestinal: Negative.   Endocrine: Negative.   Genitourinary: Negative.   Musculoskeletal: Negative.   Skin: Negative.   Allergic/Immunologic: Negative.   Neurological: Negative.   Hematological: Negative.   Psychiatric/Behavioral: Negative.        Past Medical History:  Diagnosis Date  . Bowel obstruction (HCC)   . Colon polyp   . Diabetes mellitus   . Endometriosis   . Frozen shoulder   . Hypertension    high blood pressure readings    Social History   Socioeconomic History  . Marital status: Single    Spouse name: Not on file  . Number of children: Not on file  . Years of education: Not on file  . Highest education level: Not on file  Occupational History  . Not on file  Social Needs  . Financial resource strain: Not on file  . Food insecurity:    Worry: Not on file    Inability: Not on file  . Transportation needs:    Medical: Not on file  Non-medical: Not on file  Tobacco Use  . Smoking status: Never Smoker  . Smokeless tobacco: Never Used  Substance and Sexual Activity  . Alcohol use: No  . Drug use: No  . Sexual activity: Not on file  Lifestyle  . Physical activity:    Days per week: Not on file    Minutes per session: Not on file  . Stress: Not on file  Relationships  . Social connections:    Talks on phone: Not on file    Gets together: Not on file    Attends religious service: Not on file    Active member of club or organization: Not on file    Attends meetings of clubs or organizations: Not on file    Relationship  status: Not on file  . Intimate partner violence:    Fear of current or ex partner: Not on file    Emotionally abused: Not on file    Physically abused: Not on file    Forced sexual activity: Not on file  Other Topics Concern  . 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 CLAVICLE EXCISION  2012  . COLECTOMY  Feb 2010   sigmoid  . TONSILLECTOMY AND ADENOIDECTOMY  1989  . VENTRAL HERNIA REPAIR  09-30-09    Family History  Problem Relation Age of Onset  . Breast cancer Mother 60       Smoker  . Elevated Lipids Mother   . Hypertension Mother   . Diabetes Mother   . Elevated Lipids Father   . Colon cancer Maternal Grandmother   . Lung cancer Maternal Grandmother   . Lung cancer Maternal Grandfather        Smoker  . Brain cancer Maternal Grandfather   . Uterine cancer Paternal Grandmother   . Diabetes Paternal Grandfather     Allergies  Allergen Reactions  . Penicillins     Current Outpatient Medications on File Prior to Visit  Medication Sig Dispense Refill  . atorvastatin (LIPITOR) 40 MG tablet Take 1 tablet (40 mg total) by mouth daily. 90 tablet 1  . gabapentin (NEURONTIN) 300 MG capsule TAKE 1 CAPSULE (300 MG TOTAL) BY MOUTH 2 (TWO) TIMES DAILY. 90 capsule 1  . lisinopril (PRINIVIL,ZESTRIL) 40 MG tablet TAKE 1 TABLET BY MOUTH EVERY DAY 90 tablet 2   No current facility-administered medications on file prior to visit.     BP 140/88   Temp 97.8 F (36.6 C)   Ht 5' 7.5" (1.715 m)   Wt 190 lb (86.2 kg)   LMP 11/15/2010 (Approximate)   BMI 29.32 kg/m       Objective:   Physical Exam Vitals signs and nursing note reviewed.  Constitutional:      General: She is not in acute distress.    Appearance: Normal appearance. She is well-developed.  HENT:     Head: Normocephalic and atraumatic.     Right Ear: Tympanic membrane, ear canal and external  ear normal.     Left Ear: Tympanic membrane, ear canal and external ear normal.     Nose: Nose normal.     Mouth/Throat:     Mouth: Mucous membranes are moist.     Pharynx: Oropharynx is clear. No oropharyngeal exudate.  Eyes:     General:        Right eye: No  discharge.        Left eye: No discharge.     Conjunctiva/sclera: Conjunctivae normal.  Neck:     Musculoskeletal: Normal range of motion and neck supple.     Thyroid: No thyromegaly.     Trachea: No tracheal deviation.  Cardiovascular:     Rate and Rhythm: Normal rate and regular rhythm.     Heart sounds: Normal heart sounds. No murmur. No friction rub. No gallop.   Pulmonary:     Effort: Pulmonary effort is normal. No respiratory distress.     Breath sounds: Normal breath sounds. No wheezing or rales.  Chest:     Chest wall: No tenderness.  Abdominal:     General: Bowel sounds are normal. There is no distension.     Palpations: Abdomen is soft. There is no mass.     Tenderness: There is no abdominal tenderness. There is no right CVA tenderness, guarding or rebound.     Hernia: No hernia is present.  Musculoskeletal: Normal range of motion.  Lymphadenopathy:     Cervical: No cervical adenopathy.  Skin:    General: Skin is warm and dry.     Capillary Refill: Capillary refill takes less than 2 seconds.     Coloration: Skin is not pale.     Findings: No erythema or rash.  Neurological:     General: No focal deficit present.     Mental Status: She is alert and oriented to person, place, and time. Mental status is at baseline.     Cranial Nerves: No cranial nerve deficit.     Coordination: Coordination normal.  Psychiatric:        Behavior: Behavior normal.        Thought Content: Thought content normal.        Judgment: Judgment normal.        Assessment & Plan:  1. Routine general medical examination at a health care facility - Flu and Prevanr 23 given today  - Needs to have diabetic eye exam done  - Follow up  in one year or sooner if needed  - CBC with Differential/Platelet - Comprehensive metabolic panel - Hemoglobin A1c - Lipid panel - TSH  2. Mixed hyperlipidemia - Consider increase in Gabapentin  - CBC with Differential/Platelet - Comprehensive metabolic panel - Hemoglobin A1c - Lipid panel - TSH  3. Neuropathy - Continue with gabapentin   4. Type 2 diabetes mellitus with diabetic neuropathy, without long-term current use of insulin (HCC) - Encouraged diet and exercise  - Follow up in 3 months  - Likely needs insulin therapy but she refuses  - CBC with Differential/Platelet - Comprehensive metabolic panel - Hemoglobin A1c - Lipid panel - TSH - canagliflozin (INVOKANA) 300 MG TABS tablet; Take 1 tablet (300 mg total) by mouth daily before breakfast.  Dispense: 90 tablet; Refill: 1 - Dulaglutide (TRULICITY) 0.75 MG/0.5ML SOPN; Inject 0.75 mg into the skin once a week.  Dispense: 4 pen; Refill: 2 - glipiZIDE (GLUCOTROL) 10 MG tablet; Take 1 tablet (10 mg total) by mouth 2 (two) times daily before a meal.  Dispense: 180 tablet; Refill: 1 - metFORMIN (GLUCOPHAGE) 1000 MG tablet; Take 1 tablet (1,000 mg total) by mouth 2 (two) times daily with a meal. TAKE 1 TABLET BY MOUTH TWICE A DAY WITH MEALS  Dispense: 180 tablet; Refill: 1  5. Essential hypertension - Will increase Norvasc to 10 mg  - CBC with Differential/Platelet - Comprehensive metabolic panel - Hemoglobin A1c - Lipid  panel - TSH - amLODipine (NORVASC) 10 MG tablet; Take 1 tablet (10 mg total) by mouth daily.  Dispense: 90 tablet; Refill: 3  6. Need for hepatitis C screening test  - Hep C Antibody  7. Encounter for screening for HIV  - HIV Antibody (routine testing w rflx)  8. Colon cancer screening  - Ambulatory referral to Gastroenterology  9. Anxiety - single dose xanax 0.5 mg given to take 30 minutes prior to surgery    Shirline Frees, NP

## 2018-10-28 LAB — HEPATITIS C ANTIBODY
Hepatitis C Ab: NONREACTIVE
SIGNAL TO CUT-OFF: 0.08 (ref <1.00)

## 2018-10-28 LAB — HIV ANTIBODY (ROUTINE TESTING W REFLEX): HIV 1&2 Ab, 4th Generation: NONREACTIVE

## 2018-11-28 DIAGNOSIS — C44311 Basal cell carcinoma of skin of nose: Secondary | ICD-10-CM | POA: Diagnosis not present

## 2018-11-28 DIAGNOSIS — C44329 Squamous cell carcinoma of skin of other parts of face: Secondary | ICD-10-CM | POA: Diagnosis not present

## 2018-11-28 HISTORY — PX: MOHS SURGERY: SHX181

## 2018-12-01 ENCOUNTER — Other Ambulatory Visit: Payer: Self-pay | Admitting: Adult Health

## 2018-12-01 DIAGNOSIS — E1140 Type 2 diabetes mellitus with diabetic neuropathy, unspecified: Secondary | ICD-10-CM

## 2018-12-01 NOTE — Telephone Encounter (Signed)
Copied from CRM [id number]. Topic: Quick Communication - Rx Refill/Question >> Mary-Ann 17, 2020  9:56 AM Gaynelle Adu wrote: Medication: canagliflozin (INVOKANA) 300 MG TABS tablet  Has the patient contacted their pharmacy? yes  Preferred Pharmacy (with phone number [facility] [address] [facility] [place], [place] - [address] [phone] (Phone) [phone] (Fax)    Agent: Please be advised that RX refills may take up to 3 business days. We ask that you follow-up with your pharmacy.

## 2019-01-19 ENCOUNTER — Encounter: Payer: Self-pay | Admitting: Family Medicine

## 2019-02-02 ENCOUNTER — Other Ambulatory Visit: Payer: Self-pay | Admitting: Adult Health

## 2019-02-02 DIAGNOSIS — E782 Mixed hyperlipidemia: Secondary | ICD-10-CM

## 2019-05-13 ENCOUNTER — Other Ambulatory Visit: Payer: Self-pay | Admitting: Adult Health

## 2019-05-13 DIAGNOSIS — E1140 Type 2 diabetes mellitus with diabetic neuropathy, unspecified: Secondary | ICD-10-CM

## 2019-05-15 NOTE — Telephone Encounter (Signed)
Left a message for a return call.  Pt needs A1C follow up.

## 2019-05-17 ENCOUNTER — Other Ambulatory Visit: Payer: Self-pay | Admitting: Adult Health

## 2019-05-17 DIAGNOSIS — E1140 Type 2 diabetes mellitus with diabetic neuropathy, unspecified: Secondary | ICD-10-CM

## 2019-05-17 NOTE — Telephone Encounter (Signed)
Left a message for a return call.

## 2019-05-22 NOTE — Telephone Encounter (Signed)
Can we call her tomorrow... I will fill for 30 days but she needs to have an A1c done in the time frame

## 2019-05-22 NOTE — Telephone Encounter (Signed)
Left a message for a return call.

## 2019-05-23 ENCOUNTER — Ambulatory Visit: Payer: BLUE CROSS/BLUE SHIELD | Admitting: Adult Health

## 2019-05-23 NOTE — Telephone Encounter (Signed)
Sent to the pharmacy by e-scribe for 30 days. 

## 2019-05-23 NOTE — Telephone Encounter (Signed)
Sent to the pharmacy by e-scribe. 

## 2019-05-26 ENCOUNTER — Other Ambulatory Visit: Payer: Self-pay | Admitting: Adult Health

## 2019-05-29 ENCOUNTER — Encounter: Payer: Self-pay | Admitting: Family Medicine

## 2019-05-29 NOTE — Telephone Encounter (Signed)
30 day supply sent to the pharmacy by e-scribe.  Order for A1C mailed to the pt.  Currently lives at [facility].

## 2019-06-13 ENCOUNTER — Other Ambulatory Visit: Payer: Self-pay | Admitting: Adult Health

## 2019-06-13 DIAGNOSIS — I10 Essential (primary) hypertension: Secondary | ICD-10-CM

## 2019-06-20 ENCOUNTER — Other Ambulatory Visit: Payer: Self-pay | Admitting: Adult Health

## 2019-06-20 DIAGNOSIS — E1140 Type 2 diabetes mellitus with diabetic neuropathy, unspecified: Secondary | ICD-10-CM

## 2019-06-20 MED ORDER — CANAGLIFLOZIN 300 MG PO TABS: 300.0000 mg | ORAL_TABLET | Freq: Every day | ORAL | 0 refills | Status: DC

## 2019-07-05 ENCOUNTER — Other Ambulatory Visit: Payer: Self-pay | Admitting: Adult Health

## 2019-07-05 DIAGNOSIS — E782 Mixed hyperlipidemia: Secondary | ICD-10-CM

## 2019-07-06 DIAGNOSIS — E1140 Type 2 diabetes mellitus with diabetic neuropathy, unspecified: Secondary | ICD-10-CM | POA: Diagnosis not present

## 2019-07-06 NOTE — Telephone Encounter (Signed)
Sent to the pharmacy by e-scribe. 

## 2019-07-20 ENCOUNTER — Other Ambulatory Visit: Payer: Self-pay | Admitting: Adult Health

## 2019-07-20 DIAGNOSIS — E1140 Type 2 diabetes mellitus with diabetic neuropathy, unspecified: Secondary | ICD-10-CM

## 2019-07-24 NOTE — Telephone Encounter (Signed)
Left a message for a return call.

## 2019-08-06 DIAGNOSIS — C44329 Squamous cell carcinoma of skin of other parts of face: Secondary | ICD-10-CM | POA: Diagnosis not present

## 2019-08-06 DIAGNOSIS — C44311 Basal cell carcinoma of skin of nose: Secondary | ICD-10-CM | POA: Diagnosis not present

## 2019-08-08 ENCOUNTER — Other Ambulatory Visit: Payer: Self-pay

## 2019-08-08 ENCOUNTER — Ambulatory Visit (INDEPENDENT_AMBULATORY_CARE_PROVIDER_SITE_OTHER): Payer: BC Managed Care – PPO | Admitting: Adult Health

## 2019-08-08 ENCOUNTER — Encounter: Payer: Self-pay | Admitting: Adult Health

## 2019-08-08 VITALS — BP 110/80 | HR 115 | Temp 98.0°F | Wt 188.0 lb

## 2019-08-08 DIAGNOSIS — E1140 Type 2 diabetes mellitus with diabetic neuropathy, unspecified: Secondary | ICD-10-CM

## 2019-08-08 DIAGNOSIS — Z23 Encounter for immunization: Secondary | ICD-10-CM

## 2019-08-08 LAB — BASIC METABOLIC PANEL
BUN: 15 mg/dL (ref 6–23)
CO2: 25 mEq/L (ref 19–32)
Calcium: 10.1 mg/dL (ref 8.4–10.5)
Chloride: 96 mEq/L (ref 96–112)
Creatinine, Ser: 0.76 mg/dL (ref 0.40–1.20)
GFR: 77.98 mL/min (ref >60.00)
Glucose, Bld: 324 mg/dL — ABNORMAL HIGH (ref 70–99)
Potassium: 4.6 mEq/L (ref 3.5–5.1)
Sodium: 133 mEq/L — ABNORMAL LOW (ref 135–145)

## 2019-08-08 LAB — HEMOGLOBIN A1C: Hgb A1c MFr Bld: 12.4 % — ABNORMAL HIGH (ref 4.6–6.5)

## 2019-08-08 MED ORDER — METFORMIN HCL 1000 MG PO TABS: 1000.0000 mg | ORAL_TABLET | Freq: Two times a day (BID) | ORAL | 0 refills | Status: DC

## 2019-08-08 MED ORDER — GLIPIZIDE 10 MG PO TABS: 10.0000 mg | ORAL_TABLET | Freq: Two times a day (BID) | ORAL | 0 refills | Status: DC

## 2019-08-08 NOTE — Addendum Note (Signed)
Addended by: Alverda Skeans on: 08/08/2019 02:56 PM   Modules accepted: Orders

## 2019-08-08 NOTE — Progress Notes (Signed)
Subjective:    Patient ID: Breanna Hall, female    DOB: 08/21/1961, 58 y.o.   MRN: [id number]  HPI 59 year old female who  has a past medical history of Bowel obstruction ([place]), Colon polyp, Diabetes mellitus, Endometriosis, Frozen shoulder, and Hypertension.  She presents to the office today for follow up regarding diabetes. Her current regimen includes that of Invokana 300 mg daily, Glipizide 10 mg BID, Metformin 2353 mg BID, and Trulicity 6.14 mg weekly.   Her last A1c was 8.7 - 9 months ago. This was a drop from 10.1. She has slowly been coming down from a high of 13.7. She has been out of Metformin and Glipizide for two weeks.   She has not been working out and her diet " has not been the best".    Review of Systems See HPI   Past Medical History:  Diagnosis Date  . Bowel obstruction ([place])   . Colon polyp   . Diabetes mellitus   . Endometriosis   . Frozen shoulder   . Hypertension    high blood pressure readings    Social History   Socioeconomic History  . Marital status: Single    Spouse name: Not on file  . Number of children: Not on file  . Years of education: Not on file  . Highest education level: Not on file  Occupational History  . Not on file  Social Needs  . Financial resource strain: Not on file  . Food insecurity    Worry: Not on file    Inability: Not on file  . Transportation needs    Medical: Not on file    Non-medical: Not on file  Tobacco Use  . Smoking status: Never Smoker  . Smokeless tobacco: Never Used  Substance and Sexual Activity  . Alcohol use: No  . Drug use: No  . Sexual activity: Not on file  Lifestyle  . Physical activity    Days per week: Not on file    Minutes per session: Not on file  . Stress: Not on file  Relationships  . Social Herbalist on phone: Not on file    Gets together: Not on file    Attends religious service: Not on file    Active member of club or organization: Not on file    Attends  meetings of clubs or organizations: Not on file    Relationship status: Not on file  . Intimate partner violence    Fear of current or ex partner: Not on file    Emotionally abused: Not on file    Physically abused: Not on file    Forced sexual activity: Not on file  Other Topics Concern  . 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 CLAVICLE EXCISION  2012  . COLECTOMY  Feb 2010   sigmoid  . MOHS SURGERY Left 11/28/2018   Basal Cell Carcinoma- infiltrating/Left Dorsum of nose  . TONSILLECTOMY AND ADENOIDECTOMY  1989  . VENTRAL HERNIA REPAIR  09-30-09    Family History  Problem Relation Age of Onset  . Breast cancer Mother 37       Smoker  . Elevated Lipids Mother   . Hypertension Mother   . Diabetes Mother   . Elevated Lipids Father   . Colon cancer Maternal Grandmother   .  Lung cancer Maternal Grandmother   . Lung cancer Maternal Grandfather        Smoker  . Brain cancer Maternal Grandfather   . Uterine cancer Paternal Grandmother   . Diabetes Paternal Grandfather     Allergies  Allergen Reactions  . Penicillins     Current Outpatient Medications on File Prior to Visit  Medication Sig Dispense Refill  . ALPRAZolam (XANAX) 0.5 MG tablet 30 minutes prior to derm procedure. 1 tablet 0  . amLODipine (NORVASC) 10 MG tablet Take 1 tablet (10 mg total) by mouth daily. 90 tablet 3  . atorvastatin (LIPITOR) 40 MG tablet TAKE 1 TABLET BY MOUTH EVERY DAY 90 tablet 1  . canagliflozin (INVOKANA) 300 MG TABS tablet Take 1 tablet (300 mg total) by mouth daily before breakfast. 90 tablet 0  . gabapentin (NEURONTIN) 300 MG capsule TAKE 1 CAPSULE (300 MG TOTAL) BY MOUTH 2 (TWO) TIMES DAILY. 90 capsule 1  . glipiZIDE (GLUCOTROL) 10 MG tablet TAKE 1 TABLET (10 MG TOTAL) BY MOUTH 2 (TWO) TIMES DAILY BEFORE A MEAL. 60 tablet 0  . lisinopril (ZESTRIL) 40 MG tablet TAKE 1  TABLET BY MOUTH EVERY DAY 90 tablet 1  . metFORMIN (GLUCOPHAGE) 1000 MG tablet TAKE 1 TABLET BY MOUTH 2 TIMES DAILY WITH A MEAL.12/16 60 tablet 0  . TRULICITY 0.75 MG/0.5ML SOPN INJECT 0.75 MG INTO THE SKIN ONCE A WEEK. 4 pen 0   No current facility-administered medications on file prior to visit.     BP 110/80 (BP Location: Left Arm, Patient Position: Sitting, Cuff Size: Normal)   Pulse (!) 115   Temp 98 F (36.7 C) (Temporal)   Wt 188 lb (85.3 kg)   LMP 11/15/2010 (Approximate)   SpO2 99%   BMI 29.01 kg/m       Objective:   Physical Exam Vitals signs and nursing note reviewed.  Constitutional:      Appearance: Normal appearance.  Cardiovascular:     Rate and Rhythm: Normal rate and regular rhythm.     Pulses: Normal pulses.     Heart sounds: Normal heart sounds.  Pulmonary:     Effort: Pulmonary effort is normal.     Breath sounds: Normal breath sounds.  Musculoskeletal: Normal range of motion.        General: No swelling.  Skin:    General: Skin is warm and dry.     Capillary Refill: Capillary refill takes less than 2 seconds.  Neurological:     Mental Status: She is alert.       Assessment & Plan:  1. Type 2 diabetes mellitus with diabetic neuropathy, without long-term current use of insulin (HCC) - Consider increase in Trulicity  - Follow up in December for CPE - needs to work on lifestyle modifications  - Basic Metabolic Panel - Hemoglobin A1c - glipiZIDE (GLUCOTROL) 10 MG tablet; Take 1 tablet (10 mg total) by mouth 2 (two) times daily before a meal.  Dispense: 90 tablet; Refill: 0 - metFORMIN (GLUCOPHAGE) 1000 MG tablet; Take 1 tablet (1,000 mg total) by mouth 2 (two) times daily with a meal.  Dispense: 180 tablet; Refill: 0  Shirline Frees, NP

## 2019-08-08 NOTE — Patient Instructions (Signed)
It was great seeing you today   I will follow up with you regarding your blood work   Please follow up in December for your physical exam

## 2019-08-10 ENCOUNTER — Other Ambulatory Visit: Payer: Self-pay | Admitting: Family Medicine

## 2019-08-10 MED ORDER — TRULICITY 1.5 MG/0.5ML ~~LOC~~ SOAJ: 1.5000 mg | SUBCUTANEOUS | 0 refills | Status: DC

## 2019-08-10 NOTE — Telephone Encounter (Signed)
Sent to the pharmacy by e-scribe. 

## 2019-08-17 ENCOUNTER — Encounter: Payer: Self-pay | Admitting: Family Medicine

## 2019-09-18 ENCOUNTER — Other Ambulatory Visit: Payer: Self-pay | Admitting: Adult Health

## 2019-09-18 DIAGNOSIS — E1140 Type 2 diabetes mellitus with diabetic neuropathy, unspecified: Secondary | ICD-10-CM

## 2019-09-18 NOTE — Telephone Encounter (Signed)
Sent to the pharmacy by e-scribe.  Pt has upcoming cpx on 11/01/2019

## 2019-11-01 ENCOUNTER — Encounter: Payer: Self-pay | Admitting: Adult Health

## 2019-11-01 ENCOUNTER — Ambulatory Visit (INDEPENDENT_AMBULATORY_CARE_PROVIDER_SITE_OTHER): Payer: BC Managed Care – PPO | Admitting: Adult Health

## 2019-11-01 ENCOUNTER — Other Ambulatory Visit: Payer: Self-pay

## 2019-11-01 VITALS — BP 120/78 | Temp 97.7°F | Ht 67.25 in | Wt 183.0 lb

## 2019-11-01 DIAGNOSIS — Z0000 Encounter for general adult medical examination without abnormal findings: Secondary | ICD-10-CM

## 2019-11-01 DIAGNOSIS — E1140 Type 2 diabetes mellitus with diabetic neuropathy, unspecified: Secondary | ICD-10-CM | POA: Diagnosis not present

## 2019-11-01 DIAGNOSIS — E782 Mixed hyperlipidemia: Secondary | ICD-10-CM

## 2019-11-01 DIAGNOSIS — I10 Essential (primary) hypertension: Secondary | ICD-10-CM | POA: Diagnosis not present

## 2019-11-01 DIAGNOSIS — K2100 Gastro-esophageal reflux disease with esophagitis, without bleeding: Secondary | ICD-10-CM

## 2019-11-01 LAB — COMPREHENSIVE METABOLIC PANEL
ALT: 10 U/L (ref 0–35)
AST: 9 U/L (ref 0–37)
Albumin: 4.1 g/dL (ref 3.5–5.2)
Alkaline Phosphatase: 82 U/L (ref 39–117)
BUN: 14 mg/dL (ref 6–23)
CO2: 27 mEq/L (ref 19–32)
Calcium: 9.4 mg/dL (ref 8.4–10.5)
Chloride: 98 mEq/L (ref 96–112)
Creatinine, Ser: 0.68 mg/dL (ref 0.40–1.20)
GFR: 88.59 mL/min (ref >60.00)
Glucose, Bld: 292 mg/dL — ABNORMAL HIGH (ref 70–99)
Potassium: 4.4 mEq/L (ref 3.5–5.1)
Sodium: 135 mEq/L (ref 135–145)
Total Bilirubin: 0.4 mg/dL (ref 0.2–1.2)
Total Protein: 6.5 g/dL (ref 6.0–8.3)

## 2019-11-01 LAB — CBC WITH DIFFERENTIAL/PLATELET
Basophils Absolute: 0.1 10*3/uL (ref 0.0–0.1)
Basophils Relative: 0.8 % (ref 0.0–3.0)
Eosinophils Absolute: 0.2 10*3/uL (ref 0.0–0.7)
Eosinophils Relative: 1.5 % (ref 0.0–5.0)
HCT: 42 % (ref 36.0–46.0)
Hemoglobin: 13.9 g/dL (ref 12.0–15.0)
Lymphocytes Relative: 32 % (ref 12.0–46.0)
Lymphs Abs: 3.6 10*3/uL (ref 0.7–4.0)
MCHC: 33.2 g/dL (ref 30.0–36.0)
MCV: 80.6 fl (ref 78.0–100.0)
Monocytes Absolute: 0.7 10*3/uL (ref 0.1–1.0)
Monocytes Relative: 5.8 % (ref 3.0–12.0)
Neutro Abs: 6.8 10*3/uL (ref 1.4–7.7)
Neutrophils Relative %: 59.9 % (ref 43.0–77.0)
Platelets: 401 10*3/uL — ABNORMAL HIGH (ref 150.0–400.0)
RBC: 5.21 Mil/uL — ABNORMAL HIGH (ref 3.87–5.11)
RDW: 14.2 % (ref 11.5–15.5)
WBC: 11.3 10*3/uL — ABNORMAL HIGH (ref 4.0–10.5)

## 2019-11-01 LAB — LIPID PANEL
Cholesterol: 185 mg/dL (ref 0–200)
HDL: 46.1 mg/dL (ref >39.00)
NonHDL: 138.55
Total CHOL/HDL Ratio: 4
Triglycerides: 260 mg/dL — ABNORMAL HIGH (ref 0.0–149.0)
VLDL: 52 mg/dL — ABNORMAL HIGH (ref 0.0–40.0)

## 2019-11-01 LAB — TSH: TSH: 0.86 u[IU]/mL (ref 0.35–4.50)

## 2019-11-01 LAB — HEMOGLOBIN A1C: Hgb A1c MFr Bld: 11.3 % — ABNORMAL HIGH (ref 4.6–6.5)

## 2019-11-01 LAB — LDL CHOLESTEROL, DIRECT: Direct LDL: 89 mg/dL

## 2019-11-01 MED ORDER — OMEPRAZOLE 20 MG PO CPDR: 20.0000 mg | DELAYED_RELEASE_CAPSULE | Freq: Every day | ORAL | 3 refills | Status: DC

## 2019-11-01 MED ORDER — TRULICITY 1.5 MG/0.5ML ~~LOC~~ SOAJ: 1.5000 mg | SUBCUTANEOUS | 0 refills | Status: DC

## 2019-11-01 MED ORDER — AMLODIPINE BESYLATE 10 MG PO TABS: 10.0000 mg | ORAL_TABLET | Freq: Every day | ORAL | 3 refills | Status: DC

## 2019-11-01 MED ORDER — LISINOPRIL 40 MG PO TABS: 40.0000 mg | ORAL_TABLET | Freq: Every day | ORAL | 3 refills | Status: DC

## 2019-11-01 MED ORDER — GLIPIZIDE 10 MG PO TABS: 10.0000 mg | ORAL_TABLET | Freq: Two times a day (BID) | ORAL | 0 refills | Status: DC

## 2019-11-01 MED ORDER — METFORMIN HCL 1000 MG PO TABS: 1000.0000 mg | ORAL_TABLET | Freq: Two times a day (BID) | ORAL | 0 refills | Status: DC

## 2019-11-01 MED ORDER — ATORVASTATIN CALCIUM 40 MG PO TABS: 40.0000 mg | ORAL_TABLET | Freq: Every day | ORAL | 3 refills | Status: DC

## 2019-11-01 NOTE — Progress Notes (Signed)
Subjective:    Patient ID: Breanna Hall, female    DOB: 04/29/1961, 58 y.o.   MRN: [id number]  HPI  Patient presents for yearly preventative medicine examination. She is a pleasant 58 year old female who  has a past medical history of Bowel obstruction (HCC), Colon polyp, Diabetes mellitus, Endometriosis, Frozen shoulder, and Hypertension.  Uncontrolled DM -her current regimen includes Invokana 300 mg daily, glipizide 10 mg twice daily, metformin 1000 mg twice daily, Trulicity 1.5 mg.  She has been unwilling to see an endocrinologist as well as start on insulin therapy for uncontrolled diabetes.  She does exercise but her diet is poor.  Her last A1c had increased from 8.7-12.4. She refuses to check her blood sugar  Hypertension -currently controlled with Norvasc 10 mg and lisinopril 40 mg.  She denies dizziness, lightheadedness, chest pain, shortness of breath, or syncopal episodes BP Readings from Last 3 Encounters:  11/01/19 120/78  08/08/19 110/80  10/27/18 140/88   Hyperlipidemia -currently prescribed Lipitor 40 mg nightly.  She denies myalgia or fatigue Lab Results  Component Value Date   CHOL 160 10/27/2018   HDL 58.20 10/27/2018   LDLCALC 71 10/27/2018   LDLDIRECT 87.0 07/11/2018   TRIG 157.0 (H) 10/27/2018   CHOLHDL 3 10/27/2018    All immunizations and health maintenance protocols were reviewed with the patient and needed orders were placed. She is up to date on routine vaccinations.   Appropriate screening laboratory values were ordered for the patient including screening of hyperlipidemia, renal function and hepatic function.  Medication reconciliation,  past medical history, social history, problem list and allergies were reviewed in detail with the patient  Goals were established with regard to weight loss, exercise, and  diet in compliance with medications. She is working on weight loss through diet and exercise. She has been able to lose five pounds over  three months  Wt Readings from Last 3 Encounters:  11/01/19 183 lb (83 kg)  08/08/19 188 lb (85.3 kg)  10/27/18 190 lb (86.2 kg)   End of life planning was discussed.  She is due for routine screening mammogram, Pap, and colonoscopy. She has an appointment with Dr. Elnoria HowardHung next year. Refuses GYN exam today. [facility]   She has no acute complaints.   Review of Systems  Constitutional: Negative.   HENT: Negative.   Eyes: Negative.   Respiratory: Negative.   Cardiovascular: Negative.   Gastrointestinal: Negative.   Endocrine: Negative.   Genitourinary: Negative.   Musculoskeletal: Negative.   Skin: Negative.   Allergic/Immunologic: Negative.   Neurological: Negative.   Hematological: Negative.   Psychiatric/Behavioral: Negative.    Past Medical History:  Diagnosis Date  . Bowel obstruction (HCC)   . Colon polyp   . Diabetes mellitus   . Endometriosis   . Frozen shoulder   . Hypertension    high blood pressure readings    Social History   Socioeconomic History  . Marital status: Single    Spouse name: Not on file  . Number of children: Not on file  . Years of education: Not on file  . Highest education level: Not on file  Occupational History  . Not on file  Tobacco Use  . Smoking status: Never Smoker  . Smokeless tobacco: Never Used  Substance and Sexual Activity  . Alcohol use: No  . Drug use: No  . Sexual activity: Not on file  Other Topics Concern  .  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 Difficulty of Paying Living Expenses: Not on file  Food Insecurity:   . Worried About Charity fundraiser in the Last Year: Not on file  . Ran Out of Food in the Last Year: Not on file  Transportation Needs:   . Lack of Transportation (Medical): Not on file  . Lack of Transportation  (Non-Medical): Not on file  Physical Activity:   . Days of Exercise per Week: Not on file  . Minutes of Exercise per Session: Not on file  Stress:   . Feeling of Stress : Not on file  Social Connections:   . Frequency of Communication with Friends and Family: Not on file  . Frequency of Social Gatherings with Friends and Family: Not on file  . Attends Religious Services: Not on file  . Active Member of Clubs or Organizations: Not on file  . Attends Archivist Meetings: Not on file  . Marital Status: Not on file  Intimate Partner Violence:   . Fear of Current or Ex-Partner: Not on file  . Emotionally Abused: Not on file  . Physically Abused: Not on file  . Sexually Abused: Not on file    Past Surgical History:  Procedure Laterality Date  . CLAVICLE EXCISION  2012  . COLECTOMY  Feb 2010   sigmoid  . MOHS SURGERY Left 11/28/2018   Basal Cell Carcinoma- infiltrating/Left Dorsum of nose  . TONSILLECTOMY AND ADENOIDECTOMY  1989  . VENTRAL HERNIA REPAIR  09-30-09    Family History  Problem Relation Age of Onset  . Breast cancer Mother 37       Smoker  . Elevated Lipids Mother   . Hypertension Mother   . Diabetes Mother   . Elevated Lipids Father   . Colon cancer Maternal Grandmother   . Lung cancer Maternal Grandmother   . Lung cancer Maternal Grandfather        Smoker  . Brain cancer Maternal Grandfather   . Uterine cancer Paternal Grandmother   . Diabetes Paternal Grandfather     Allergies  Allergen Reactions  . Penicillins   . Sulfa Antibiotics     Other reaction(s): Unknown    Current Outpatient Medications on File Prior to Visit  Medication Sig Dispense Refill  . ALPRAZolam (XANAX) 0.5 MG tablet 30 minutes prior to derm procedure. 1 tablet 0  . amLODipine (NORVASC) 10 MG tablet Take 1 tablet (10 mg total) by mouth daily. 90 tablet 3  . atorvastatin (LIPITOR) 40 MG tablet TAKE 1 TABLET BY MOUTH EVERY DAY 90 tablet 1  . Dulaglutide (TRULICITY) 1.5  WU/9.8JX SOPN Inject 1.5 mg into the skin once a week. 12 pen 0  . gabapentin (NEURONTIN) 300 MG capsule TAKE 1 CAPSULE (300 MG TOTAL) BY MOUTH 2 (TWO) TIMES DAILY. 90 capsule 1  . INVOKANA 300 MG TABS tablet TAKE 1 TABLET (300 MG TOTAL) BY MOUTH DAILY BEFORE BREAKFAST. 90 tablet 0  . lisinopril (ZESTRIL) 40 MG tablet TAKE 1 TABLET BY MOUTH EVERY DAY 90 tablet 1  . metFORMIN (GLUCOPHAGE) 1000 MG tablet Take 1 tablet (1,000 mg total) by mouth 2 (two) times daily with a meal. 180 tablet 0  . glipiZIDE (GLUCOTROL) 10 MG tablet Take 1 tablet (10 mg total) by mouth 2 (two) times daily before a  meal. (Patient not taking: Reported on 11/01/2019) 90 tablet 0   No current facility-administered medications on file prior to visit.    BP 120/78   Temp 97.7 F (36.5 C) (Temporal)   Ht 5' 7.25" (1.708 m)   Wt 183 lb (83 kg)   LMP 11/15/2010 (Approximate)   BMI 28.45 kg/m       Objective:   Physical Exam Vitals and nursing note reviewed.  Constitutional:      General: She is not in acute distress.    Appearance: Normal appearance. She is well-developed and overweight.  HENT:     Head: Normocephalic and atraumatic.     Right Ear: Tympanic membrane, ear canal and external ear normal. There is no impacted cerumen.     Left Ear: Tympanic membrane, ear canal and external ear normal. There is no impacted cerumen.     Nose: Nose normal. No congestion or rhinorrhea.     Mouth/Throat:     Mouth: Mucous membranes are moist.     Pharynx: Oropharynx is clear. No oropharyngeal exudate.  Eyes:     General: No scleral icterus.       Right eye: No discharge.        Left eye: No discharge.     Conjunctiva/sclera: Conjunctivae normal.  Neck:     Thyroid: No thyromegaly.     Trachea: No tracheal deviation.  Cardiovascular:     Rate and Rhythm: Normal rate and regular rhythm.     Pulses: Normal pulses.     Heart sounds: Normal heart sounds. No murmur. No friction rub. No gallop.   Pulmonary:     Effort:  Pulmonary effort is normal. No respiratory distress.     Breath sounds: Normal breath sounds. No stridor. No wheezing, rhonchi or rales.  Chest:     Chest wall: No tenderness.  Abdominal:     General: Bowel sounds are normal. There is no distension.     Palpations: Abdomen is soft. There is no mass.     Tenderness: There is no abdominal tenderness. There is no right CVA tenderness, left CVA tenderness, guarding or rebound.     Hernia: No hernia is present.  Musculoskeletal:        General: No swelling, tenderness, deformity or signs of injury. Normal range of motion.     Cervical back: Normal range of motion and neck supple.     Right lower leg: No edema.     Left lower leg: No edema.  Lymphadenopathy:     Cervical: No cervical adenopathy.  Skin:    General: Skin is warm and dry.     Coloration: Skin is not jaundiced or pale.     Findings: No bruising, erythema, lesion or rash.  Neurological:     General: No focal deficit present.     Mental Status: She is alert and oriented to person, place, and time.     Cranial Nerves: No cranial nerve deficit.     Sensory: No sensory deficit.     Motor: No weakness.     Coordination: Coordination normal.     Gait: Gait normal.     Deep Tendon Reflexes: Reflexes normal.  Psychiatric:        Mood and Affect: Mood normal.        Behavior: Behavior normal.        Thought Content: Thought content normal.        Judgment: Judgment normal.       Assessment &  Plan:  1. Routine general medical examination at a health care facility - Needs to work on weight loss through diet and exercise  - Follow up in one year for next CPE  - CBC with Differential/Platelet - CMP - Hemoglobin A1c - Lipid panel - TSH  2. Type 2 diabetes mellitus with diabetic neuropathy, without long-term current use of insulin (HCC) - Continues to refuse insulin  - Continue to work on weight loss through diet and exercise - Follow up in 3 months  - Consider increasing  Trulicity to 3 mg weekly.  - CBC with Differential/Platelet - CMP - Hemoglobin A1c - Lipid panel - TSH - glipiZIDE (GLUCOTROL) 10 MG tablet; Take 1 tablet (10 mg total) by mouth 2 (two) times daily before a meal.  Dispense: 90 tablet; Refill: 0 - metFORMIN (GLUCOPHAGE) 1000 MG tablet; Take 1 tablet (1,000 mg total) by mouth 2 (two) times daily with a meal.  Dispense: 180 tablet; Refill: 0 - Dulaglutide (TRULICITY) 1.5 MG/0.5ML SOPN; Inject 1.5 mg into the skin once a week.  Dispense: 12 pen; Refill: 0  3. Mixed hyperlipidemia  - CBC with Differential/Platelet - CMP - Hemoglobin A1c - Lipid panel - TSH - atorvastatin (LIPITOR) 40 MG tablet; Take 1 tablet (40 mg total) by mouth daily.  Dispense: 90 tablet; Refill: 3  4. Essential hypertension - Well controlled. No change in medications  - CBC with Differential/Platelet - CMP - Hemoglobin A1c - Lipid panel - TSH - amLODipine (NORVASC) 10 MG tablet; Take 1 tablet (10 mg total) by mouth daily.  Dispense: 90 tablet; Refill: 3 - lisinopril (ZESTRIL) 40 MG tablet; Take 1 tablet (40 mg total) by mouth daily.  Dispense: 90 tablet; Refill: 3  5. Gastroesophageal reflux disease with esophagitis without hemorrhage  - omeprazole (PRILOSEC) 20 MG capsule; Take 1 capsule (20 mg total) by mouth daily.  Dispense: 30 capsule; Refill: 3   Shirline Frees, NP

## 2019-11-01 NOTE — Patient Instructions (Signed)
It was great seeing you today   I will follow up with you regarding your blood work   Schedule your mammogram   Follow up in three months

## 2019-11-02 ENCOUNTER — Other Ambulatory Visit: Payer: Self-pay | Admitting: Adult Health

## 2019-11-02 DIAGNOSIS — E1140 Type 2 diabetes mellitus with diabetic neuropathy, unspecified: Secondary | ICD-10-CM

## 2019-11-06 NOTE — Telephone Encounter (Signed)
DENIED.  THIS WAS SENT TO THE PHARMACY ON 11/01/2019.

## 2019-11-17 DIAGNOSIS — Z20822 Contact with and (suspected) exposure to covid-19: Secondary | ICD-10-CM | POA: Diagnosis not present

## 2019-11-18 DIAGNOSIS — Z20822 Contact with and (suspected) exposure to covid-19: Secondary | ICD-10-CM | POA: Diagnosis not present

## 2019-12-11 ENCOUNTER — Other Ambulatory Visit: Payer: Self-pay | Admitting: Adult Health

## 2019-12-11 DIAGNOSIS — E1140 Type 2 diabetes mellitus with diabetic neuropathy, unspecified: Secondary | ICD-10-CM

## 2019-12-17 ENCOUNTER — Other Ambulatory Visit: Payer: Self-pay | Admitting: Adult Health

## 2019-12-17 DIAGNOSIS — E1140 Type 2 diabetes mellitus with diabetic neuropathy, unspecified: Secondary | ICD-10-CM

## 2020-01-26 ENCOUNTER — Other Ambulatory Visit: Payer: Self-pay | Admitting: Adult Health

## 2020-01-26 DIAGNOSIS — E1140 Type 2 diabetes mellitus with diabetic neuropathy, unspecified: Secondary | ICD-10-CM

## 2020-01-27 ENCOUNTER — Other Ambulatory Visit: Payer: Self-pay | Admitting: Adult Health

## 2020-01-27 DIAGNOSIS — E1140 Type 2 diabetes mellitus with diabetic neuropathy, unspecified: Secondary | ICD-10-CM

## 2020-01-27 DIAGNOSIS — K2100 Gastro-esophageal reflux disease with esophagitis, without bleeding: Secondary | ICD-10-CM

## 2020-01-30 ENCOUNTER — Ambulatory Visit: Payer: BC Managed Care – PPO | Admitting: Adult Health

## 2020-01-30 NOTE — Telephone Encounter (Signed)
Ok for 30 days 

## 2020-01-30 NOTE — Telephone Encounter (Signed)
SENT TO THE PHARMACY AS INSTRUCTED. 

## 2020-01-30 NOTE — Telephone Encounter (Signed)
LEFT A MESSAGE FOR A RETURN CALL

## 2020-01-30 NOTE — Telephone Encounter (Signed)
SENT TO THE PHARMACY AS DIRECTED. 

## 2020-01-30 NOTE — Telephone Encounter (Signed)
Due for A1C follow up 

## 2020-01-30 NOTE — Telephone Encounter (Signed)
Pt called in stating that she is in the hospital with her mother in [facility] and mother s/b getting out today and pt  will call back on Friday to make an appointment to come in.  Pt state that it will probably be sometime next week due to her mother may have to go back in to the hospital.

## 2020-02-22 DIAGNOSIS — Z23 Encounter for immunization: Secondary | ICD-10-CM | POA: Diagnosis not present

## 2020-02-23 ENCOUNTER — Other Ambulatory Visit: Payer: Self-pay | Admitting: Adult Health

## 2020-02-23 DIAGNOSIS — E1140 Type 2 diabetes mellitus with diabetic neuropathy, unspecified: Secondary | ICD-10-CM

## 2020-02-25 ENCOUNTER — Other Ambulatory Visit: Payer: Self-pay | Admitting: Adult Health

## 2020-02-25 DIAGNOSIS — E1140 Type 2 diabetes mellitus with diabetic neuropathy, unspecified: Secondary | ICD-10-CM

## 2020-02-26 NOTE — Telephone Encounter (Signed)
Sent to the pharmacy by e-scribe.  Pt has upcoming appt on 03/12/20

## 2020-02-26 NOTE — Telephone Encounter (Signed)
Sent to the pharmacy by e-scribe for 1 month.  Pt has upcoming follow up.

## 2020-03-12 ENCOUNTER — Ambulatory Visit: Payer: BC Managed Care – PPO | Admitting: Adult Health

## 2020-03-13 ENCOUNTER — Other Ambulatory Visit: Payer: Self-pay | Admitting: Adult Health

## 2020-03-13 ENCOUNTER — Other Ambulatory Visit: Payer: Self-pay

## 2020-03-13 DIAGNOSIS — C44329 Squamous cell carcinoma of skin of other parts of face: Secondary | ICD-10-CM | POA: Diagnosis not present

## 2020-03-13 DIAGNOSIS — E1140 Type 2 diabetes mellitus with diabetic neuropathy, unspecified: Secondary | ICD-10-CM

## 2020-03-13 DIAGNOSIS — C44311 Basal cell carcinoma of skin of nose: Secondary | ICD-10-CM | POA: Diagnosis not present

## 2020-03-13 NOTE — Telephone Encounter (Signed)
Pt has an appt on 03/14/20.  Will hold

## 2020-03-14 ENCOUNTER — Ambulatory Visit (INDEPENDENT_AMBULATORY_CARE_PROVIDER_SITE_OTHER): Payer: BC Managed Care – PPO | Admitting: Adult Health

## 2020-03-14 ENCOUNTER — Encounter: Payer: Self-pay | Admitting: Adult Health

## 2020-03-14 VITALS — BP 110/72 | Temp 97.9°F | Wt 188.0 lb

## 2020-03-14 DIAGNOSIS — E1140 Type 2 diabetes mellitus with diabetic neuropathy, unspecified: Secondary | ICD-10-CM

## 2020-03-14 LAB — POCT GLYCOSYLATED HEMOGLOBIN (HGB A1C): HbA1c, POC (controlled diabetic range): 10.2 % — AB (ref 0.0–7.0)

## 2020-03-14 MED ORDER — TRULICITY 3 MG/0.5ML ~~LOC~~ SOAJ: 3.0000 mg | SUBCUTANEOUS | 2 refills | Status: DC

## 2020-03-14 NOTE — Progress Notes (Signed)
Subjective:    Patient ID: Breanna Hall, female    DOB: [birth year], 59 y.o.   MRN: [id number]  HPI  59 year old female who  has a past medical history of Bowel obstruction (HCC), Colon polyp, Diabetes mellitus, Endometriosis, Frozen shoulder, and Hypertension.  She presents to the office today for follow up regarding DM. She is currently prescribed glipizide 10 mg BID, Invokana 300 mg daily, metformin 1000 mg daily and Trulicity 1.5 mg. She has been unwilling to see an endocrinologist as well as start insulin therapy. She refuses to check her blood sugars. During her last check her A1c had decreased from 12.4 to 11.3.   She has been stressed with taking care of her parents and her mother was hospitalized with a broken hip. She was eating poor and drinking a lot of sugary drinks.   Lab Results  Component Value Date   HGBA1C 10.2 (A) 03/14/2020   Wt Readings from Last 3 Encounters:  03/14/20 188 lb (85.3 kg)  11/01/19 183 lb (83 kg)  08/08/19 188 lb (85.3 kg)     Review of Systems See HPI   Past Medical History:  Diagnosis Date  . Bowel obstruction (HCC)   . Colon polyp   . Diabetes mellitus   . Endometriosis   . Frozen shoulder   . Hypertension    high blood pressure readings    Social History   Socioeconomic History  . Marital status: Single    Spouse name: Not on file  . Number of children: Not on file  . Years of education: Not on file  . Highest education level: Not on file  Occupational History  . Not on file  Tobacco Use  . Smoking status: Never Smoker  . Smokeless tobacco: Never Used  Substance and Sexual Activity  . Alcohol use: No  . Drug use: No  . Sexual activity: Not on file  Other Topics Concern  . 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 Difficulty of Paying Living Expenses:   Food Insecurity:   . Worried  About Programme researcher, broadcasting/film/video in the Last Year:   . Barista in the Last Year:   Transportation Needs:   . Freight forwarder (Medical):   Marland Kitchen Lack of Transportation (Non-Medical):   Physical Activity:   . Days of Exercise per Week:   . Minutes of Exercise per Session:   Stress:   . Feeling of Stress :   Social Connections:   . Frequency of Communication with Friends and Family:   . Frequency of Social Gatherings with Friends and Family:   . Attends Religious Services:   . Active Member of Clubs or Organizations:   . Attends Banker Meetings:   Marland Kitchen Marital Status:   Intimate Partner Violence:   . Fear of Current or Ex-Partner:   . Emotionally Abused:   Marland Kitchen Physically Abused:   . Sexually Abused:     Past Surgical History:  Procedure Laterality Date  . CLAVICLE EXCISION  2012  . COLECTOMY  Feb 2010   sigmoid  . MOHS SURGERY Left 11/28/2018   Basal Cell Carcinoma- infiltrating/Left Dorsum of nose  . TONSILLECTOMY AND ADENOIDECTOMY  1989  . VENTRAL HERNIA REPAIR  09-30-09    Family History  Problem Relation Age of Onset  .  Breast cancer Mother 36       Smoker  . Elevated Lipids Mother   . Hypertension Mother   . Diabetes Mother   . Elevated Lipids Father   . Colon cancer Maternal Grandmother   . Lung cancer Maternal Grandmother   . Lung cancer Maternal Grandfather        Smoker  . Brain cancer Maternal Grandfather   . Uterine cancer Paternal Grandmother   . Diabetes Paternal Grandfather     Allergies  Allergen Reactions  . Penicillins   . Sulfa Antibiotics     Other reaction(s): Unknown    Current Outpatient Medications on File Prior to Visit  Medication Sig Dispense Refill  . ALPRAZolam (XANAX) 0.5 MG tablet 30 minutes prior to derm procedure. 1 tablet 0  . amLODipine (NORVASC) 10 MG tablet Take 1 tablet (10 mg total) by mouth daily. 90 tablet 3  . atorvastatin (LIPITOR) 40 MG tablet Take 1 tablet (40 mg total) by mouth daily. 90 tablet 3  .  gabapentin (NEURONTIN) 300 MG capsule TAKE 1 CAPSULE (300 MG TOTAL) BY MOUTH 2 (TWO) TIMES DAILY. 90 capsule 1  . glipiZIDE (GLUCOTROL) 10 MG tablet TAKE 1 TABLET BY MOUTH TWICE A DAY BEFORE A MEAL 60 tablet 0  . INVOKANA 300 MG TABS tablet TAKE 1 TABLET (300 MG TOTAL) BY MOUTH DAILY BEFORE BREAKFAST. 90 tablet 0  . lisinopril (ZESTRIL) 40 MG tablet Take 1 tablet (40 mg total) by mouth daily. 90 tablet 3  . metFORMIN (GLUCOPHAGE) 1000 MG tablet TAKE 1 TABLET BY MOUTH 2 TIMES DAILY WITH A MEAL. 60 tablet 0  . omeprazole (PRILOSEC) 20 MG capsule TAKE 1 CAPSULE BY MOUTH EVERY DAY 90 capsule 2  . TRULICITY 1.5 MG/0.5ML SOPN INJECT 1.5 MG INTO THE SKIN ONCE A WEEK. 4 pen 0   No current facility-administered medications on file prior to visit.    BP 110/72   Temp 97.9 F (36.6 C)   Wt 188 lb (85.3 kg)   LMP 11/15/2010 (Approximate)   BMI 29.23 kg/m       Objective:   Physical Exam Vitals and nursing note reviewed.  Constitutional:      Appearance: Normal appearance.  Cardiovascular:     Rate and Rhythm: Normal rate and regular rhythm.     Pulses: Normal pulses.     Heart sounds: Normal heart sounds.  Pulmonary:     Effort: Pulmonary effort is normal.     Breath sounds: Normal breath sounds.  Musculoskeletal:        General: Normal range of motion.  Skin:    General: Skin is warm and dry.  Neurological:     General: No focal deficit present.     Mental Status: She is alert and oriented to person, place, and time.  Psychiatric:        Mood and Affect: Mood normal.        Behavior: Behavior normal.        Thought Content: Thought content normal.        Judgment: Judgment normal.       Assessment & Plan:  1. Type 2 diabetes mellitus with diabetic neuropathy, without long-term current use of insulin (HCC)  - POCT A1C- 10.2 - not at goal. Continues to refuse insulin. Needs to work on diet. Advised [place] to get her away from sugary drinks. Will increase Trulicity to 3 mg  dosing weekly.  - Follow up in three months  - Dulaglutide (TRULICITY)  3 MG/0.5ML SOPN; Inject 3 mg as directed once a week.  Dispense: 4 pen; Refill: 2  Dorothyann Peng, NP

## 2020-03-14 NOTE — Telephone Encounter (Signed)
SENT TO THE PHARMACY BY E-SCRIBE. 

## 2020-03-29 ENCOUNTER — Other Ambulatory Visit: Payer: Self-pay | Admitting: Adult Health

## 2020-03-29 DIAGNOSIS — E1140 Type 2 diabetes mellitus with diabetic neuropathy, unspecified: Secondary | ICD-10-CM

## 2020-04-01 DIAGNOSIS — Z23 Encounter for immunization: Secondary | ICD-10-CM | POA: Diagnosis not present

## 2020-04-01 NOTE — Telephone Encounter (Signed)
SENT TO THE PHARMACY BY E-SCRIBE. 

## 2020-06-11 ENCOUNTER — Other Ambulatory Visit: Payer: Self-pay | Admitting: Adult Health

## 2020-06-11 DIAGNOSIS — E1140 Type 2 diabetes mellitus with diabetic neuropathy, unspecified: Secondary | ICD-10-CM

## 2020-06-12 ENCOUNTER — Other Ambulatory Visit: Payer: Self-pay | Admitting: Adult Health

## 2020-06-12 DIAGNOSIS — E1140 Type 2 diabetes mellitus with diabetic neuropathy, unspecified: Secondary | ICD-10-CM

## 2020-06-12 NOTE — Telephone Encounter (Signed)
Sent to the pharmacy by e-scribe.  Pt has upcoming follow up for diabetes on 06/17/20. 

## 2020-06-12 NOTE — Telephone Encounter (Signed)
Sent to the pharmacy by e-scribe.  Pt has upcoming follow up for diabetes on 06/17/20.

## 2020-06-17 ENCOUNTER — Ambulatory Visit: Payer: BC Managed Care – PPO | Admitting: Adult Health

## 2020-07-03 ENCOUNTER — Other Ambulatory Visit: Payer: Self-pay | Admitting: Adult Health

## 2020-07-03 DIAGNOSIS — E1140 Type 2 diabetes mellitus with diabetic neuropathy, unspecified: Secondary | ICD-10-CM

## 2020-07-09 ENCOUNTER — Ambulatory Visit: Payer: BC Managed Care – PPO | Admitting: Adult Health

## 2020-07-16 ENCOUNTER — Other Ambulatory Visit: Payer: Self-pay

## 2020-07-16 ENCOUNTER — Ambulatory Visit (INDEPENDENT_AMBULATORY_CARE_PROVIDER_SITE_OTHER): Payer: BC Managed Care – PPO | Admitting: Adult Health

## 2020-07-16 ENCOUNTER — Encounter: Payer: Self-pay | Admitting: Adult Health

## 2020-07-16 VITALS — BP 112/78 | HR 113 | Temp 98.2°F | Wt 186.8 lb

## 2020-07-16 DIAGNOSIS — E1140 Type 2 diabetes mellitus with diabetic neuropathy, unspecified: Secondary | ICD-10-CM | POA: Diagnosis not present

## 2020-07-16 LAB — POCT GLYCOSYLATED HEMOGLOBIN (HGB A1C): Hemoglobin A1C: 9.5 % — AB (ref 4.0–5.6)

## 2020-07-16 MED ORDER — GABAPENTIN 300 MG PO CAPS: ORAL_CAPSULE | ORAL | 1 refills | Status: DC

## 2020-07-16 MED ORDER — EMPAGLIFLOZIN 25 MG PO TABS: 25.0000 mg | ORAL_TABLET | Freq: Every day | ORAL | 0 refills | Status: DC

## 2020-07-16 MED ORDER — TRULICITY 3 MG/0.5ML ~~LOC~~ SOAJ: 3.0000 mg | SUBCUTANEOUS | 2 refills | Status: DC

## 2020-07-16 NOTE — Progress Notes (Signed)
Subjective:    Patient ID: Breanna Hall, female    DOB: Oct 29, 1961, 59 y.o.   MRN: [id number]  HPI 59 year old female who  has a past medical history of Bowel obstruction (HCC), Colon polyp, Diabetes mellitus, Endometriosis, Frozen shoulder, and Hypertension.   She presents to the office today for follow-up regarding diabetes mellitus.  She is currently prescribed glipizide 10 mg twice daily, Invokana 300 mg daily, Metformin 1000 mg daily and Trulicity 3 mg weekly.  She has been unwilling to see an endocrinologist as well as start insulin therapy.  She refuses to check her blood sugars at home.  During her last A1c check in April her A1c had dropped from 11.3-10.2.  At this time Trulicity was increased to the 3 mg dose.  Lab Results  Component Value Date   HGBA1C 10.2 (A) 03/14/2020   Today she reports that Invokana is no longer the preferred medication with her pharmacy and would like to switch to [place]. She has not been exercising due to the heat but is trying to eat healthy. She is also having to take care of her elderly parents.  Wt Readings from Last 3 Encounters:  07/16/20 186 lb 12.8 oz (84.7 kg)  03/14/20 188 lb (85.3 kg)  11/01/19 183 lb (83 kg)    Review of Systems See HPI   Past Medical History:  Diagnosis Date  . Bowel obstruction (HCC)   . Colon polyp   . Diabetes mellitus   . Endometriosis   . Frozen shoulder   . Hypertension    high blood pressure readings    Social History   Socioeconomic History  . Marital status: Single    Spouse name: Not on file  . Number of children: Not on file  . Years of education: Not on file  . Highest education level: Not on file  Occupational History  . Not on file  Tobacco Use  . Smoking status: Never Smoker  . Smokeless tobacco: Never Used  Substance and Sexual Activity  . Alcohol use: No  . Drug use: No  . Sexual activity: Not on file  Other Topics Concern  . Not on file  Social History Narrative    Works in a call center answering phose   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   Social Determinants of Health   Financial Resource Strain:   . Difficulty of Paying Living Expenses: Not on file  Food Insecurity:   . Worried About Programme researcher, broadcasting/film/video in the Last Year: Not on file  . Ran Out of Food in the Last Year: Not on file  Transportation Needs:   . Lack of Transportation (Medical): Not on file  . Lack of Transportation (Non-Medical): Not on file  Physical Activity:   . Days of Exercise per Week: Not on file  . Minutes of Exercise per Session: Not on file  Stress:   . Feeling of Stress : Not on file  Social Connections:   . Frequency of Communication with Friends and Family: Not on file  . Frequency of Social Gatherings with Friends and Family: Not on file  . Attends Religious Services: Not on file  . Active Member of Clubs or Organizations: Not on file  . Attends Banker Meetings: Not on file  . Marital Status: Not on file  Intimate Partner Violence:   . Fear of Current or Ex-Partner: Not on file  . Emotionally Abused: Not on file  . Physically Abused: Not on  file  . Sexually Abused: Not on file    Past Surgical History:  Procedure Laterality Date  . CLAVICLE EXCISION  2012  . COLECTOMY  Feb 2010   sigmoid  . MOHS SURGERY Left 11/28/2018   Basal Cell Carcinoma- infiltrating/Left Dorsum of nose  . TONSILLECTOMY AND ADENOIDECTOMY  1989  . VENTRAL HERNIA REPAIR  09-30-09    Family History  Problem Relation Age of Onset  . Breast cancer Mother 40       Smoker  . Elevated Lipids Mother   . Hypertension Mother   . Diabetes Mother   . Elevated Lipids Father   . Colon cancer Maternal Grandmother   . Lung cancer Maternal Grandmother   . Lung cancer Maternal Grandfather        Smoker  . Brain cancer Maternal Grandfather   . Uterine cancer Paternal Grandmother   . Diabetes Paternal Grandfather     Allergies  Allergen Reactions  . Penicillins   . Sulfa Antibiotics      Other reaction(s): Unknown    Current Outpatient Medications on File Prior to Visit  Medication Sig Dispense Refill  . ALPRAZolam (XANAX) 0.5 MG tablet 30 minutes prior to derm procedure. 1 tablet 0  . amLODipine (NORVASC) 10 MG tablet Take 1 tablet (10 mg total) by mouth daily. 90 tablet 3  . atorvastatin (LIPITOR) 40 MG tablet Take 1 tablet (40 mg total) by mouth daily. 90 tablet 3  . Dulaglutide (TRULICITY) 3 MG/0.5ML SOPN Inject 3 mg as directed once a week. 4 pen 2  . gabapentin (NEURONTIN) 300 MG capsule TAKE 1 CAPSULE (300 MG TOTAL) BY MOUTH 2 (TWO) TIMES DAILY. 90 capsule 1  . glipiZIDE (GLUCOTROL) 10 MG tablet TAKE 1 TABLET BY MOUTH TWICE A DAY BEFORE MEALS 180 tablet 0  . INVOKANA 300 MG TABS tablet TAKE 1 TABLET BY MOUTH EVERY DAY BEFORE BREAKFAST 90 tablet 0  . lisinopril (ZESTRIL) 40 MG tablet Take 1 tablet (40 mg total) by mouth daily. 90 tablet 3  . metFORMIN (GLUCOPHAGE) 1000 MG tablet TAKE 1 TABLET BY MOUTH TWICE A DAY WITH MEALS 180 tablet 0  . omeprazole (PRILOSEC) 20 MG capsule TAKE 1 CAPSULE BY MOUTH EVERY DAY 90 capsule 2   No current facility-administered medications on file prior to visit.    LMP 11/15/2010 (Approximate)       Objective:   Physical Exam Vitals and nursing note reviewed.  Constitutional:      Appearance: Normal appearance.  Cardiovascular:     Rate and Rhythm: Normal rate and regular rhythm.     Pulses: Normal pulses.     Heart sounds: Normal heart sounds.  Pulmonary:     Breath sounds: Normal breath sounds.  Musculoskeletal:        General: Normal range of motion.  Skin:    General: Skin is warm and dry.     Capillary Refill: Capillary refill takes less than 2 seconds.  Neurological:     General: No focal deficit present.     Mental Status: She is alert.  Psychiatric:        Mood and Affect: Mood normal.        Behavior: Behavior normal.        Thought Content: Thought content normal.        Judgment: Judgment normal.         Assessment & Plan:  1. Type 2 diabetes mellitus with diabetic neuropathy, without long-term current use of insulin (HCC)  -  POC HgB A1c- 9.5 - has improved but not at goal just yet.  - Will switch her over to [place]. Now that the weather is breaking she should be able to exercise more often.  - Follow up in 3 months  - empagliflozin (JARDIANCE) 25 MG TABS tablet; Take 1 tablet (25 mg total) by mouth daily before breakfast.  Dispense: 90 tablet; Refill: 0 - Dulaglutide (TRULICITY) 3 MG/0.5ML SOPN; Inject 0.5 mLs (3 mg total) as directed once a week.  Dispense: 2 mL; Refill: 2  Wellsite geologist

## 2020-07-16 NOTE — Patient Instructions (Addendum)
It was great seeing you today   Your A1c is   Please schedule your physical exam after 10/31/2020

## 2020-09-04 ENCOUNTER — Other Ambulatory Visit: Payer: Self-pay | Admitting: Adult Health

## 2020-09-04 DIAGNOSIS — E1140 Type 2 diabetes mellitus with diabetic neuropathy, unspecified: Secondary | ICD-10-CM

## 2020-10-15 ENCOUNTER — Other Ambulatory Visit: Payer: Self-pay | Admitting: Adult Health

## 2020-10-15 DIAGNOSIS — E1140 Type 2 diabetes mellitus with diabetic neuropathy, unspecified: Secondary | ICD-10-CM

## 2020-10-29 ENCOUNTER — Ambulatory Visit (INDEPENDENT_AMBULATORY_CARE_PROVIDER_SITE_OTHER): Payer: BC Managed Care – PPO | Admitting: Adult Health

## 2020-10-29 ENCOUNTER — Other Ambulatory Visit: Payer: Self-pay

## 2020-10-29 ENCOUNTER — Encounter: Payer: Self-pay | Admitting: Adult Health

## 2020-10-29 VITALS — BP 128/70 | HR 124 | Temp 97.8°F | Resp 15 | Ht 67.25 in | Wt 184.6 lb

## 2020-10-29 DIAGNOSIS — E1140 Type 2 diabetes mellitus with diabetic neuropathy, unspecified: Secondary | ICD-10-CM

## 2020-10-29 LAB — POCT GLYCOSYLATED HEMOGLOBIN (HGB A1C): Hemoglobin A1C: 10.6 % — AB (ref 4.0–5.6)

## 2020-10-29 MED ORDER — TRULICITY 3 MG/0.5ML ~~LOC~~ SOAJ: 3.0000 mg | SUBCUTANEOUS | 0 refills | Status: DC

## 2020-10-29 MED ORDER — FREESTYLE LIBRE 2 SENSOR MISC: 3 refills | Status: DC

## 2020-10-29 MED ORDER — METFORMIN HCL 1000 MG PO TABS: 1000.0000 mg | ORAL_TABLET | Freq: Two times a day (BID) | ORAL | 0 refills | Status: DC

## 2020-10-29 NOTE — Progress Notes (Signed)
Subjective:    Patient ID: Breanna Hall, female    DOB: Jan 10, 1961, 59 y.o.   MRN: [id number]  HPI 59 year old female who  has a past medical history of Bowel obstruction (HCC), Colon polyp, Diabetes mellitus, Endometriosis, Frozen shoulder, and Hypertension.  She presents to the office today for 82-month follow-up regarding diabetes mellitus.  She is currently prescribed glipizide 10 mg twice daily, Jardiance 25 mg daily, Metformin 1000 mg daily and Trulicity 3 mg weekly.  In the past she has been unwilling to see an endocrinologist as well as start insulin therapy.  She does refuse to check her blood sugars at home.  Diabetes has been uncontrolled for some time now.  During her last visit in September her Invokana was no longer the preferred medication with her pharmacy so we switched her to [facility].  Lab Results  Component Value Date   HGBA1C 9.5 (A) 07/16/2020   Since starting Jardiance she has not had any issues with the medication.  Per patient she reports that she has not been exercising much as she is taking care of her elderly parents.  She has been drinking a lot of the juice such as apple cider, and at least 1 soda a day.  She has increased her water consumption to 220 ounce bottles a day.  She is not eating as much due to not feeling as hungry from the Trulicity.  Wt Readings from Last 3 Encounters:  10/29/20 184 lb 9.6 oz (83.7 kg)  07/16/20 186 lb 12.8 oz (84.7 kg)  03/14/20 188 lb (85.3 kg)    Review of Systems See HPI   Past Medical History:  Diagnosis Date   Bowel obstruction (HCC)    Colon polyp    Diabetes mellitus    Endometriosis    Frozen shoulder    Hypertension    high blood pressure readings    Social History   Socioeconomic History   Marital status: Single    Spouse name: Not on file   Number of children: Not on file   Years of education: Not on file   Highest education level: Not on file  Occupational History   Not on  file  Tobacco Use   Smoking status: Never Smoker   Smokeless tobacco: Never Used  Substance and Sexual Activity   Alcohol use: No   Drug use: No   Sexual activity: Not on file  Other Topics Concern   Not on file  Social History Narrative   Works in a call center answering phose   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   Social Determinants of Health   Financial Resource Strain: Not on file  Food Insecurity: Not on file  Transportation Needs: Not on file  Physical Activity: Not on file  Stress: Not on file  Social Connections: Not on file  Intimate Partner Violence: Not on file    Past Surgical History:  Procedure Laterality Date   CLAVICLE EXCISION  2012   COLECTOMY  Feb 2010   sigmoid   MOHS SURGERY Left 11/28/2018   Basal Cell Carcinoma- infiltrating/Left Dorsum of nose   TONSILLECTOMY AND ADENOIDECTOMY  1989   VENTRAL HERNIA REPAIR  09-30-09    Family History  Problem Relation Age of Onset   Breast cancer Mother 75       Smoker   Elevated Lipids Mother    Hypertension Mother    Diabetes Mother    Elevated Lipids Father    Colon cancer Maternal Grandmother  Lung cancer Maternal Grandmother    Lung cancer Maternal Grandfather        Smoker   Brain cancer Maternal Grandfather    Uterine cancer Paternal Grandmother    Diabetes Paternal Grandfather     Allergies  Allergen Reactions   Penicillins    Sulfa Antibiotics     Other reaction(s): Unknown    Current Outpatient Medications on File Prior to Visit  Medication Sig Dispense Refill   amLODipine (NORVASC) 10 MG tablet Take 1 tablet (10 mg total) by mouth daily. 90 tablet 3   atorvastatin (LIPITOR) 40 MG tablet Take 1 tablet (40 mg total) by mouth daily. 90 tablet 3   Dulaglutide (TRULICITY) 3 MG/0.5ML SOPN Inject 0.5 mLs (3 mg total) as directed once a week. 2 mL 2   gabapentin (NEURONTIN) 300 MG capsule TAKE 1 CAPSULE (300 MG TOTAL) BY MOUTH 2 (TWO) TIMES DAILY. 90 capsule 1   glipiZIDE  (GLUCOTROL) 10 MG tablet TAKE 1 TABLET BY MOUTH TWICE A DAY BEFORE MEALS 180 tablet 0   JARDIANCE 25 MG TABS tablet TAKE 1 TABLET (25 MG TOTAL) BY MOUTH DAILY BEFORE BREAKFAST. 90 tablet 0   lisinopril (ZESTRIL) 40 MG tablet Take 1 tablet (40 mg total) by mouth daily. 90 tablet 3   metFORMIN (GLUCOPHAGE) 1000 MG tablet TAKE 1 TABLET BY MOUTH TWICE A DAY WITH MEALS 180 tablet 0   omeprazole (PRILOSEC) 20 MG capsule TAKE 1 CAPSULE BY MOUTH EVERY DAY 90 capsule 2   No current facility-administered medications on file prior to visit.    BP 128/70 (BP Location: Left Arm, Patient Position: Sitting, Cuff Size: Normal)    Pulse (!) 124    Temp 97.8 F (36.6 C) (Oral)    Resp 15    Ht 5' 7.25" (1.708 m)    Wt 184 lb 9.6 oz (83.7 kg)    LMP 11/15/2010 (Approximate)    SpO2 99%    BMI 28.70 kg/m       Objective:   Physical Exam Vitals and nursing note reviewed.  Constitutional:      Appearance: Normal appearance.  Cardiovascular:     Rate and Rhythm: Normal rate and regular rhythm.     Pulses: Normal pulses.     Heart sounds: Normal heart sounds.  Pulmonary:     Effort: Pulmonary effort is normal.     Breath sounds: Normal breath sounds.  Skin:    General: Skin is warm and dry.  Neurological:     General: No focal deficit present.     Mental Status: She is alert and oriented to person, place, and time.  Psychiatric:        Mood and Affect: Mood normal.        Behavior: Behavior normal.        Thought Content: Thought content normal.        Judgment: Judgment normal.       Assessment & Plan:  1. Type 2 diabetes mellitus with diabetic neuropathy, without long-term current use of insulin (HCC)  - POCT HgB A1C- 10.6 - has increased -She refuses to increase Trulicity to 4.5 mg and continues to refuse insulin.  I was able to talk her into using the freestyle libre sensor with her cell phone.  We placed her first sensor on her today during the office visit and she was able to  demonstrate how to do it at home. - Continuous Blood Gluc Sensor (FREESTYLE LIBRE 2 SENSOR) MISC; Use libre app  Dispense: 6 each; Refill: 3 - metFORMIN (GLUCOPHAGE) 1000 MG tablet; Take 1 tablet (1,000 mg total) by mouth 2 (two) times daily with a meal.  Dispense: 180 tablet; Refill: 0 - Dulaglutide (TRULICITY) 3 MG/0.5ML SOPN; Inject 3 mg as directed once a week.  Dispense: 6 mL; Refill: 0 - She is to start exercising, advised no juice or soda and increase her water consumption by at least 40 ounces a day. - Follow up in 3 months   Shirline Frees, NP

## 2020-12-25 ENCOUNTER — Other Ambulatory Visit: Payer: Self-pay | Admitting: Adult Health

## 2020-12-25 DIAGNOSIS — E782 Mixed hyperlipidemia: Secondary | ICD-10-CM

## 2020-12-25 NOTE — Telephone Encounter (Signed)
Left a message for a return call.  Upcoming appointment needs to be cpx.

## 2020-12-26 NOTE — Telephone Encounter (Signed)
Left a message for a return call.

## 2020-12-26 NOTE — Telephone Encounter (Signed)
Sent to the pharmacy by e-scribe.  Pt has an upcoming cpx. 

## 2021-01-06 ENCOUNTER — Other Ambulatory Visit: Payer: Self-pay | Admitting: Adult Health

## 2021-01-06 DIAGNOSIS — I10 Essential (primary) hypertension: Secondary | ICD-10-CM

## 2021-01-07 DIAGNOSIS — Z23 Encounter for immunization: Secondary | ICD-10-CM | POA: Diagnosis not present

## 2021-01-07 NOTE — Telephone Encounter (Signed)
SENT TO THE PHARMACY BY E-SCRIBE FOR 90 DAYS.  PT HAS AN UPCOMING CPX.

## 2021-01-13 ENCOUNTER — Other Ambulatory Visit: Payer: Self-pay | Admitting: Adult Health

## 2021-01-13 DIAGNOSIS — E1140 Type 2 diabetes mellitus with diabetic neuropathy, unspecified: Secondary | ICD-10-CM

## 2021-01-26 ENCOUNTER — Other Ambulatory Visit: Payer: Self-pay | Admitting: Adult Health

## 2021-01-26 DIAGNOSIS — I10 Essential (primary) hypertension: Secondary | ICD-10-CM

## 2021-02-04 ENCOUNTER — Ambulatory Visit: Payer: BC Managed Care – PPO | Admitting: Adult Health

## 2021-02-10 ENCOUNTER — Encounter: Payer: BC Managed Care – PPO | Admitting: Adult Health

## 2021-02-12 ENCOUNTER — Encounter: Payer: Self-pay | Admitting: Adult Health

## 2021-02-12 ENCOUNTER — Other Ambulatory Visit: Payer: Self-pay

## 2021-02-12 ENCOUNTER — Ambulatory Visit (INDEPENDENT_AMBULATORY_CARE_PROVIDER_SITE_OTHER): Payer: BC Managed Care – PPO | Admitting: Adult Health

## 2021-02-12 VITALS — BP 118/80 | HR 74 | Temp 98.3°F | Ht 67.5 in | Wt 175.2 lb

## 2021-02-12 DIAGNOSIS — K2100 Gastro-esophageal reflux disease with esophagitis, without bleeding: Secondary | ICD-10-CM

## 2021-02-12 DIAGNOSIS — Z0000 Encounter for general adult medical examination without abnormal findings: Secondary | ICD-10-CM | POA: Diagnosis not present

## 2021-02-12 DIAGNOSIS — G629 Polyneuropathy, unspecified: Secondary | ICD-10-CM

## 2021-02-12 DIAGNOSIS — I10 Essential (primary) hypertension: Secondary | ICD-10-CM

## 2021-02-12 DIAGNOSIS — E782 Mixed hyperlipidemia: Secondary | ICD-10-CM

## 2021-02-12 DIAGNOSIS — E1140 Type 2 diabetes mellitus with diabetic neuropathy, unspecified: Secondary | ICD-10-CM | POA: Diagnosis not present

## 2021-02-12 LAB — CBC WITH DIFFERENTIAL/PLATELET
Basophils Absolute: 0.1 10*3/uL (ref 0.0–0.1)
Basophils Relative: 0.6 % (ref 0.0–3.0)
Eosinophils Absolute: 0.2 10*3/uL (ref 0.0–0.7)
Eosinophils Relative: 1.7 % (ref 0.0–5.0)
HCT: 42.1 % (ref 36.0–46.0)
Hemoglobin: 14.1 g/dL (ref 12.0–15.0)
Lymphocytes Relative: 37.3 % (ref 12.0–46.0)
Lymphs Abs: 3.4 10*3/uL (ref 0.7–4.0)
MCHC: 33.6 g/dL (ref 30.0–36.0)
MCV: 78 fl (ref 78.0–100.0)
Monocytes Absolute: 0.6 10*3/uL (ref 0.1–1.0)
Monocytes Relative: 6.8 % (ref 3.0–12.0)
Neutro Abs: 4.9 10*3/uL (ref 1.4–7.7)
Neutrophils Relative %: 53.6 % (ref 43.0–77.0)
Platelets: 375 10*3/uL (ref 150.0–400.0)
RBC: 5.39 Mil/uL — ABNORMAL HIGH (ref 3.87–5.11)
RDW: 15.7 % — ABNORMAL HIGH (ref 11.5–15.5)
WBC: 9.1 10*3/uL (ref 4.0–10.5)

## 2021-02-12 LAB — LIPID PANEL
Cholesterol: 211 mg/dL — ABNORMAL HIGH (ref 0–200)
HDL: 49.4 mg/dL (ref >39.00)
NonHDL: 161.75
Total CHOL/HDL Ratio: 4
Triglycerides: 327 mg/dL — ABNORMAL HIGH (ref 0.0–149.0)
VLDL: 65.4 mg/dL — ABNORMAL HIGH (ref 0.0–40.0)

## 2021-02-12 LAB — COMPREHENSIVE METABOLIC PANEL
ALT: 8 U/L (ref 0–35)
AST: 8 U/L (ref 0–37)
Albumin: 4.2 g/dL (ref 3.5–5.2)
Alkaline Phosphatase: 84 U/L (ref 39–117)
BUN: 21 mg/dL (ref 6–23)
CO2: 27 mEq/L (ref 19–32)
Calcium: 9.5 mg/dL (ref 8.4–10.5)
Chloride: 100 mEq/L (ref 96–112)
Creatinine, Ser: 0.6 mg/dL (ref 0.40–1.20)
GFR: 97.71 mL/min (ref >60.00)
Glucose, Bld: 229 mg/dL — ABNORMAL HIGH (ref 70–99)
Potassium: 4.5 mEq/L (ref 3.5–5.1)
Sodium: 137 mEq/L (ref 135–145)
Total Bilirubin: 0.4 mg/dL (ref 0.2–1.2)
Total Protein: 7.2 g/dL (ref 6.0–8.3)

## 2021-02-12 LAB — LDL CHOLESTEROL, DIRECT: Direct LDL: 100 mg/dL

## 2021-02-12 LAB — HEMOGLOBIN A1C: Hgb A1c MFr Bld: 9.5 % — ABNORMAL HIGH (ref 4.6–6.5)

## 2021-02-12 LAB — TSH: TSH: 1.42 u[IU]/mL (ref 0.35–4.50)

## 2021-02-12 MED ORDER — FREESTYLE LIBRE 2 SENSOR MISC
3 refills | Status: DC
Start: 2021-02-12 — End: 2022-10-28

## 2021-02-12 MED ORDER — OMEPRAZOLE 20 MG PO CPDR: 20.0000 mg | DELAYED_RELEASE_CAPSULE | Freq: Every day | ORAL | 3 refills | Status: DC

## 2021-02-12 MED ORDER — METFORMIN HCL 1000 MG PO TABS
1000.0000 mg | ORAL_TABLET | Freq: Two times a day (BID) | ORAL | 0 refills | Status: DC
Start: 2021-02-12 — End: 2021-05-28

## 2021-02-12 MED ORDER — GLIPIZIDE 10 MG PO TABS: ORAL_TABLET | ORAL | 0 refills | Status: DC

## 2021-02-12 MED ORDER — GABAPENTIN 300 MG PO CAPS: ORAL_CAPSULE | ORAL | 1 refills | Status: DC

## 2021-02-12 MED ORDER — TRULICITY 3 MG/0.5ML ~~LOC~~ SOAJ: 3.0000 mg | SUBCUTANEOUS | 0 refills | Status: DC

## 2021-02-12 NOTE — Progress Notes (Signed)
Subjective:    Patient ID: Breanna Hall, female    DOB: 06-Apr-1961, 60 y.o.   MRN: [id number]  HPI Patient presents for yearly preventative medicine examination. She is a pleasant 60 year old female who  has a past medical history of Bowel obstruction (HCC), Colon polyp, Diabetes mellitus, Endometriosis, Frozen shoulder, and Hypertension.   Since her last visit she reports that her father passed away from Covid and then she got Covid.   DM -prescribed glipizide 10 mg twice daily, Jardiance 25 mg daily, Metformin 1000 mg daily, and Trulicity 4.5 mg weekly.  [name] has been uncontrolled for some time.  She has refused to see endocrinology as well as start insulin therapy in the past.  She is now monitoring her blood sugars with [facility] system. Per her log, she has been in goal range 42% of the time over the last 90 days. Over the last week she has had some spikes into the 400's.   Lab Results  Component Value Date   HGBA1C 10.6 (A) 10/29/2020   Essential Hypertension -takes lisinopril 40 mg daily and Norvasc 10 mg daily.  She denies dizziness, lightheadedness, chest pain, or shortness of breath BP Readings from Last 3 Encounters:  02/12/21 118/80  10/29/20 128/70  07/16/20 112/78   GERD - controlled with prilosec 20 mg daily   Hyperlipidemia - takes Lipitor 40 mg daily. Denies myalgia or fatigue  Lab Results  Component Value Date   CHOL 185 11/01/2019   HDL 46.10 11/01/2019   LDLCALC 71 10/27/2018   LDLDIRECT 89.0 11/01/2019   TRIG 260.0 (H) 11/01/2019   CHOLHDL 4 11/01/2019   Diabetic Neuropathy -managed with gabapentin 300 mg twice daily  All immunizations and health maintenance protocols were reviewed with the patient and needed orders were placed.  Appropriate screening laboratory values were ordered for the patient including screening of hyperlipidemia, renal function and hepatic function.   Medication reconciliation,  past medical history, social history,  problem list and allergies were reviewed in detail with the patient  Goals were established with regard to weight loss, exercise, and  diet in compliance with medications  Wt Readings from Last 3 Encounters:  02/12/21 175 lb 3.2 oz (79.5 kg)  10/29/20 184 lb 9.6 oz (83.7 kg)  07/16/20 186 lb 12.8 oz (84.7 kg)   She is overdue for colonoscopy, mammogram, and Pap. She has a GI and reports that she will make a an appointment. She also has a GYN and will schedule with them    Review of Systems  Constitutional: Negative.   HENT: Negative.   Eyes: Negative.   Respiratory: Negative.   Cardiovascular: Negative.   Gastrointestinal: Negative.   Endocrine: Negative.   Genitourinary: Negative.   Musculoskeletal: Negative.   Skin: Negative.   Allergic/Immunologic: Negative.   Neurological: Negative.   Hematological: Negative.   Psychiatric/Behavioral: Negative.      Past Medical History:  Diagnosis Date  . Bowel obstruction (HCC)   . Colon polyp   . Diabetes mellitus   . Endometriosis   . Frozen shoulder   . Hypertension    high blood pressure readings    Social History   Socioeconomic History  . Marital status: Single    Spouse name: Not on file  . Number of children: Not on file  . Years of education: Not on file  . Highest education level: Not on file  Occupational History  . Not on file  Tobacco Use  . Smoking status: Never  Smoker  . Smokeless tobacco: Never Used  Substance and Sexual Activity  . Alcohol use: No  . Drug use: No  . Sexual activity: Not on file  Other Topics Concern  . Not on file  Social History Narrative   Works in a call center answering phose   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   Social Determinants of Health   Financial Resource Strain: Not on file  Food Insecurity: Not on file  Transportation Needs: Not on file  Physical Activity: Not on file  Stress: Not on file  Social Connections: Not on file  Intimate Partner Violence: Not on file    Past  Surgical History:  Procedure Laterality Date  . CLAVICLE EXCISION  2012  . COLECTOMY  Feb 2010   sigmoid  . MOHS SURGERY Left 11/28/2018   Basal Cell Carcinoma- infiltrating/Left Dorsum of nose  . TONSILLECTOMY AND ADENOIDECTOMY  1989  . VENTRAL HERNIA REPAIR  09-30-09    Family History  Problem Relation Age of Onset  . Breast cancer Mother 1840       Smoker  . Elevated Lipids Mother   . Hypertension Mother   . Diabetes Mother   . Elevated Lipids Father   . Colon cancer Maternal Grandmother   . Lung cancer Maternal Grandmother   . Lung cancer Maternal Grandfather        Smoker  . Brain cancer Maternal Grandfather   . Uterine cancer Paternal Grandmother   . Diabetes Paternal Grandfather     Allergies  Allergen Reactions  . Penicillins   . Sulfa Antibiotics     Other reaction(s): Unknown    Current Outpatient Medications on File Prior to Visit  Medication Sig Dispense Refill  . amLODipine (NORVASC) 10 MG tablet TAKE 1 TABLET BY MOUTH EVERY DAY 90 tablet 3  . atorvastatin (LIPITOR) 40 MG tablet TAKE 1 TABLET BY MOUTH EVERY DAY 90 tablet 0  . Continuous Blood Gluc Sensor (FREESTYLE LIBRE 2 SENSOR) MISC Use libre app 6 each 3  . Dulaglutide (TRULICITY) 3 MG/0.5ML SOPN Inject 3 mg as directed once a week. 6 mL 0  . gabapentin (NEURONTIN) 300 MG capsule TAKE 1 CAPSULE (300 MG TOTAL) BY MOUTH 2 (TWO) TIMES DAILY. 90 capsule 1  . glipiZIDE (GLUCOTROL) 10 MG tablet TAKE 1 TABLET BY MOUTH TWICE A DAY BEFORE MEALS 180 tablet 0  . JARDIANCE 25 MG TABS tablet TAKE 1 TABLET BY MOUTH DAILY BEFORE BREAKFAST. 90 tablet 0  . lisinopril (ZESTRIL) 40 MG tablet TAKE 1 TABLET BY MOUTH EVERY DAY 90 tablet 0  . metFORMIN (GLUCOPHAGE) 1000 MG tablet Take 1 tablet (1,000 mg total) by mouth 2 (two) times daily with a meal. 180 tablet 0  . omeprazole (PRILOSEC) 20 MG capsule TAKE 1 CAPSULE BY MOUTH EVERY DAY 90 capsule 2   No current facility-administered medications on file prior to visit.    BP  118/80 (BP Location: Left Arm, Patient Position: Sitting, Cuff Size: Normal)   Pulse 74   Temp 98.3 F (36.8 C) (Oral)   Ht 5' 7.5" (1.715 m)   Wt 175 lb 3.2 oz (79.5 kg)   LMP 11/15/2010 (Approximate)   SpO2 99%   BMI 27.04 kg/m       Objective:   Physical Exam Vitals and nursing note reviewed.  Constitutional:      General: She is not in acute distress.    Appearance: Normal appearance. She is well-developed. She is not ill-appearing.  HENT:  Head: Normocephalic and atraumatic.     Right Ear: Tympanic membrane, ear canal and external ear normal. There is no impacted cerumen.     Left Ear: Tympanic membrane, ear canal and external ear normal. There is no impacted cerumen.     Nose: Nose normal. No congestion or rhinorrhea.     Mouth/Throat:     Mouth: Mucous membranes are moist.     Pharynx: Oropharynx is clear. No oropharyngeal exudate or posterior oropharyngeal erythema.  Eyes:     General:        Right eye: No discharge.        Left eye: No discharge.     Extraocular Movements: Extraocular movements intact.     Conjunctiva/sclera: Conjunctivae normal.     Pupils: Pupils are equal, round, and reactive to light.  Neck:     Thyroid: No thyromegaly.     Vascular: No carotid bruit.     Trachea: No tracheal deviation.  Cardiovascular:     Rate and Rhythm: Normal rate and regular rhythm.     Pulses: Normal pulses.     Heart sounds: Normal heart sounds. No murmur heard. No friction rub. No gallop.   Pulmonary:     Effort: Pulmonary effort is normal. No respiratory distress.     Breath sounds: Normal breath sounds. No stridor. No wheezing, rhonchi or rales.  Chest:     Chest wall: No tenderness.  Abdominal:     General: Abdomen is flat. Bowel sounds are normal. There is no distension.     Palpations: Abdomen is soft. There is no mass.     Tenderness: There is no abdominal tenderness. There is no right CVA tenderness, left CVA tenderness, guarding or rebound.      Hernia: No hernia is present.  Musculoskeletal:        General: No swelling, tenderness, deformity or signs of injury. Normal range of motion.     Cervical back: Normal range of motion and neck supple.     Right lower leg: No edema.     Left lower leg: No edema.  Lymphadenopathy:     Cervical: No cervical adenopathy.  Skin:    General: Skin is warm and dry.     Coloration: Skin is not jaundiced or pale.     Findings: No bruising, erythema, lesion or rash.  Neurological:     General: No focal deficit present.     Mental Status: She is alert and oriented to person, place, and time.     Cranial Nerves: No cranial nerve deficit.     Sensory: No sensory deficit.     Motor: No weakness.     Coordination: Coordination normal.     Gait: Gait normal.     Deep Tendon Reflexes: Reflexes normal.  Psychiatric:        Mood and Affect: Mood normal.        Behavior: Behavior normal.        Thought Content: Thought content normal.        Judgment: Judgment normal.       Assessment & Plan:  1. Routine general medical examination at a health care facility  - CBC with Differential/Platelet; Future - Comprehensive metabolic panel; Future - Hemoglobin A1c; Future - Lipid panel; Future - TSH; Future - TSH - Lipid panel - Hemoglobin A1c - Comprehensive metabolic panel - CBC with Differential/Platelet  2. Type 2 diabetes mellitus with diabetic neuropathy, without long-term current use of insulin (HCC) - Would benefit  from insulin but refuses  - Follow up in three months  - CBC with Differential/Platelet; Future - Comprehensive metabolic panel; Future - Hemoglobin A1c; Future - Lipid panel; Future - TSH; Future - TSH - Lipid panel - Hemoglobin A1c - Comprehensive metabolic panel - CBC with Differential/Platelet - Continuous Blood Gluc Sensor (FREESTYLE LIBRE 2 SENSOR) MISC; Use libre app  Dispense: 6 each; Refill: 3 - glipiZIDE (GLUCOTROL) 10 MG tablet; TAKE 1 TABLET BY MOUTH TWICE A  DAY BEFORE MEALS  Dispense: 180 tablet; Refill: 0 - metFORMIN (GLUCOPHAGE) 1000 MG tablet; Take 1 tablet (1,000 mg total) by mouth 2 (two) times daily with a meal.  Dispense: 180 tablet; Refill: 0 - Dulaglutide (TRULICITY) 3 MG/0.5ML SOPN; Inject 3 mg as directed once a week.  Dispense: 6 mL; Refill: 0  3. Mixed hyperlipidemia - Consider increase in statin  - CBC with Differential/Platelet; Future - Comprehensive metabolic panel; Future - Hemoglobin A1c; Future - Lipid panel; Future - TSH; Future - TSH - Lipid panel - Hemoglobin A1c - Comprehensive metabolic panel - CBC with Differential/Platelet  4. Essential hypertension - Well controlled. No change in medications  - CBC with Differential/Platelet; Future - Comprehensive metabolic panel; Future - Hemoglobin A1c; Future - Lipid panel; Future - TSH; Future - TSH - Lipid panel - Hemoglobin A1c - Comprehensive metabolic panel - CBC with Differential/Platelet  5. Gastroesophageal reflux disease with esophagitis without hemorrhage - Continue with prilosec  - CBC with Differential/Platelet; Future - Comprehensive metabolic panel; Future - Hemoglobin A1c; Future - Lipid panel; Future - TSH; Future - TSH - Lipid panel - Hemoglobin A1c - Comprehensive metabolic panel - CBC with Differential/Platelet - omeprazole (PRILOSEC) 20 MG capsule; Take 1 capsule (20 mg total) by mouth daily.  Dispense: 90 capsule; Refill: 3  6. Neuropathy  - CBC with Differential/Platelet; Future - Comprehensive metabolic panel; Future - Hemoglobin A1c; Future - Lipid panel; Future - TSH; Future - TSH - Lipid panel - Hemoglobin A1c - Comprehensive metabolic panel - CBC with Differential/Platelet - gabapentin (NEURONTIN) 300 MG capsule; TAKE 1 CAPSULE (300 MG TOTAL) BY MOUTH 2 (TWO) TIMES DAILY.  Dispense: 90 capsule; Refill: 1

## 2021-02-17 ENCOUNTER — Telehealth: Payer: Self-pay | Admitting: Adult Health

## 2021-02-17 DIAGNOSIS — E782 Mixed hyperlipidemia: Secondary | ICD-10-CM

## 2021-02-17 MED ORDER — ATORVASTATIN CALCIUM 80 MG PO TABS: 80.0000 mg | ORAL_TABLET | Freq: Every day | ORAL | 3 refills | Status: DC

## 2021-02-17 NOTE — Telephone Encounter (Signed)
Updated patient on her labs   Will increase lipitor to 80 mg d/t elevated cholesterol levels   A1c improved to 9.5 - she will stay on the same medications

## 2021-02-28 ENCOUNTER — Other Ambulatory Visit: Payer: Self-pay | Admitting: Adult Health

## 2021-02-28 DIAGNOSIS — E1140 Type 2 diabetes mellitus with diabetic neuropathy, unspecified: Secondary | ICD-10-CM

## 2021-03-04 ENCOUNTER — Other Ambulatory Visit: Payer: Self-pay | Admitting: Adult Health

## 2021-03-04 DIAGNOSIS — E1140 Type 2 diabetes mellitus with diabetic neuropathy, unspecified: Secondary | ICD-10-CM

## 2021-04-04 ENCOUNTER — Other Ambulatory Visit: Payer: Self-pay | Admitting: Adult Health

## 2021-04-04 DIAGNOSIS — I10 Essential (primary) hypertension: Secondary | ICD-10-CM

## 2021-04-04 DIAGNOSIS — E782 Mixed hyperlipidemia: Secondary | ICD-10-CM

## 2021-05-03 ENCOUNTER — Other Ambulatory Visit: Payer: Self-pay | Admitting: Adult Health

## 2021-05-03 DIAGNOSIS — E1140 Type 2 diabetes mellitus with diabetic neuropathy, unspecified: Secondary | ICD-10-CM

## 2021-05-20 ENCOUNTER — Ambulatory Visit: Payer: BC Managed Care – PPO | Admitting: Adult Health

## 2021-05-20 NOTE — Progress Notes (Deleted)
Subjective:    Patient ID: Breanna Hall, female    DOB: [birth year], 60 y.o.   MRN: [id number]  HPI  60 year old female who  has a past medical history of Bowel obstruction (HCC), Colon polyp, Diabetes mellitus, Endometriosis, Frozen shoulder, and Hypertension.  She presents to the office today for three month follow up regarding DM.   DM -current treatment includes glipizide 10 mg twice daily, Jardiance 25 mg daily, metformin 1000 mg daily, and Trulicity 4.5 mg weekly.  Her disease has been uncontrolled for some time.  In the past she has refused to see endocrinology as well as start insulin therapy.  Thankfully, she is now monitoring her blood sugars with the [facility] system.  Per her log she has been in goal range about 42% of the time over the last 90 days.  During her last visit her A1c dropped from 10.6-9.5.  Lab Results  Component Value Date   HGBA1C 9.5 (H) 02/12/2021   Essential Hypertension -takes lisinopril 40 mg daily and Norvasc 10 mg daily.  She denies dizziness, lightheadedness, chest pain, or shortness of breath. BP Readings from Last 3 Encounters:  02/12/21 118/80  10/29/20 128/70  07/16/20 112/78    Review of Systems See HPI   Past Medical History:  Diagnosis Date   Bowel obstruction (HCC)    Colon polyp    Diabetes mellitus    Endometriosis    Frozen shoulder    Hypertension    high blood pressure readings    Social History   Socioeconomic History   Marital status: Single    Spouse name: Not on file   Number of children: Not on file   Years of education: Not on file   Highest education level: Not on file  Occupational History   Not on file  Tobacco Use   Smoking status: Never   Smokeless tobacco: Never  Substance and Sexual Activity   Alcohol use: No   Drug use: No   Sexual activity: Not on file  Other Topics Concern   Not on file  Social History Narrative   Works in a call center answering phose   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   Social  Determinants of Health   Financial Resource Strain: Not on file  Food Insecurity: Not on file  Transportation Needs: Not on file  Physical Activity: Not on file  Stress: Not on file  Social Connections: Not on file  Intimate Partner Violence: Not on file    Past Surgical History:  Procedure Laterality Date   CLAVICLE EXCISION  2012   COLECTOMY  Feb 2010   sigmoid   MOHS SURGERY Left 11/28/2018   Basal Cell Carcinoma- infiltrating/Left Dorsum of nose   TONSILLECTOMY AND ADENOIDECTOMY  1989   VENTRAL HERNIA REPAIR  09-30-09    Family History  Problem Relation Age of Onset   Breast cancer Mother 32       Smoker   Elevated Lipids Mother    Hypertension Mother    Diabetes Mother    Elevated Lipids Father    Colon cancer Maternal Grandmother    Lung cancer Maternal Grandmother    Lung cancer Maternal Grandfather        Smoker   Brain cancer Maternal Grandfather    Uterine cancer Paternal Grandmother    Diabetes Paternal Grandfather     Allergies  Allergen Reactions   Penicillins    Sulfa Antibiotics     Other reaction(s): Unknown  Current Outpatient Medications on File Prior to Visit  Medication Sig Dispense Refill   atorvastatin (LIPITOR) 80 MG tablet Take 1 tablet (80 mg total) by mouth daily. 90 tablet 3   glipiZIDE (GLUCOTROL) 10 MG tablet TAKE 1 TABLET BY MOUTH TWICE A DAY BEFORE MEALS 180 tablet 0   amLODipine (NORVASC) 10 MG tablet TAKE 1 TABLET BY MOUTH EVERY DAY 90 tablet 3   Continuous Blood Gluc Sensor (FREESTYLE LIBRE 2 SENSOR) MISC Use libre app 6 each 3   Dulaglutide (TRULICITY) 3 MG/0.5ML SOPN Inject 3 mg as directed once a week. For DM (dx. E11.40) 6 mL 0   gabapentin (NEURONTIN) 300 MG capsule TAKE 1 CAPSULE (300 MG TOTAL) BY MOUTH 2 (TWO) TIMES DAILY. 90 capsule 1   JARDIANCE 25 MG TABS tablet TAKE 1 TABLET BY MOUTH EVERY DAY BEFORE BREAKFAST 90 tablet 0   lisinopril (ZESTRIL) 40 MG tablet TAKE 1 TABLET BY MOUTH EVERY DAY 90 tablet 0   metFORMIN  (GLUCOPHAGE) 1000 MG tablet Take 1 tablet (1,000 mg total) by mouth 2 (two) times daily with a meal. 180 tablet 0   omeprazole (PRILOSEC) 20 MG capsule Take 1 capsule (20 mg total) by mouth daily. 90 capsule 3   No current facility-administered medications on file prior to visit.    LMP 11/15/2010 (Approximate)       Objective:   Physical Exam Vitals and nursing note reviewed.  Constitutional:      Appearance: Normal appearance.  Cardiovascular:     Rate and Rhythm: Normal rate and regular rhythm.     Pulses: Normal pulses.     Heart sounds: Normal heart sounds.  Pulmonary:     Effort: Pulmonary effort is normal.     Breath sounds: Normal breath sounds.  Musculoskeletal:        General: Normal range of motion.  Skin:    General: Skin is warm and dry.  Neurological:     General: No focal deficit present.     Mental Status: She is alert and oriented to person, place, and time.  Psychiatric:        Mood and Affect: Mood normal.        Behavior: Behavior normal.        Thought Content: Thought content normal.        Judgment: Judgment normal.      Assessment & Plan:

## 2021-05-28 ENCOUNTER — Telehealth (INDEPENDENT_AMBULATORY_CARE_PROVIDER_SITE_OTHER): Payer: BC Managed Care – PPO | Admitting: Adult Health

## 2021-05-28 ENCOUNTER — Encounter: Payer: Self-pay | Admitting: Adult Health

## 2021-05-28 VITALS — Ht 67.5 in | Wt 175.0 lb

## 2021-05-28 DIAGNOSIS — E1140 Type 2 diabetes mellitus with diabetic neuropathy, unspecified: Secondary | ICD-10-CM | POA: Diagnosis not present

## 2021-05-28 MED ORDER — METFORMIN HCL 1000 MG PO TABS: 1000.0000 mg | ORAL_TABLET | Freq: Two times a day (BID) | ORAL | 0 refills | Status: DC

## 2021-05-28 MED ORDER — GLIPIZIDE 10 MG PO TABS: 10.0000 mg | ORAL_TABLET | Freq: Two times a day (BID) | ORAL | 0 refills | Status: DC

## 2021-05-28 MED ORDER — TRULICITY 3 MG/0.5ML ~~LOC~~ SOAJ: 3.0000 mg | SUBCUTANEOUS | 0 refills | Status: DC

## 2021-05-28 NOTE — Progress Notes (Signed)
Virtual Visit via Video Note  I connected with Breanna Hall on 05/28/21 at 11:30 AM EDT by a video enabled telemedicine application and verified that I am speaking with the correct person using two identifiers.  Location patient: home Location provider:work or home office Persons participating in the virtual visit: patient, provider  I discussed the limitations of evaluation and management by telemedicine and the availability of in person appointments. The patient expressed understanding and agreed to proceed.   HPI: Is a 60 year old female who who is being evaluated today for medication management regarding diabetes mellitus.  She has been uncontrolled sometime.  Her last A1c was 9.5 which was an improvement.  She usually follows up every 3 months, unable to make it to the office as she reports that her elderly mother has been in and out of the hospital most recently with a broken hip, [place] is unable to come in at this time.  She is out of medication and needs refills.  She has not been checking her blood sugars but plans to start using the [facility] system again.  She has plenty of sensors at home.  Wt Readings from Last 3 Encounters:  05/28/21 175 lb (79.4 kg)  02/12/21 175 lb 3.2 oz (79.5 kg)  10/29/20 184 lb 9.6 oz (83.7 kg)      ROS: See pertinent positives and negatives per HPI.  Past Medical History:  Diagnosis Date   Bowel obstruction (HCC)    Colon polyp    Diabetes mellitus    Endometriosis    Frozen shoulder    Hypertension    high blood pressure readings    Past Surgical History:  Procedure Laterality Date   CLAVICLE EXCISION  2012   COLECTOMY  Feb 2010   sigmoid   MOHS SURGERY Left 11/28/2018   Basal Cell Carcinoma- infiltrating/Left Dorsum of nose   TONSILLECTOMY AND ADENOIDECTOMY  1989   VENTRAL HERNIA REPAIR  09-30-09    Family History  Problem Relation Age of Onset   Breast cancer Mother 22       Smoker    Elevated Lipids Mother    Hypertension Mother    Diabetes Mother    Elevated Lipids Father    Colon cancer Maternal Grandmother    Lung cancer Maternal Grandmother    Lung cancer Maternal Grandfather        Smoker   Brain cancer Maternal Grandfather    Uterine cancer Paternal Grandmother    Diabetes Paternal Grandfather        Current Outpatient Medications:    amLODipine (NORVASC) 10 MG tablet, TAKE 1 TABLET BY MOUTH EVERY DAY, Disp: 90 tablet, Rfl: 3   atorvastatin (LIPITOR) 80 MG tablet, Take 1 tablet (80 mg total) by mouth daily., Disp: 90 tablet, Rfl: 3   Continuous Blood Gluc Sensor (FREESTYLE LIBRE 2 SENSOR) MISC, Use libre app, Disp: 6 each, Rfl: 3   Dulaglutide (TRULICITY) 3 MG/0.5ML SOPN, Inject 3 mg as directed once a week. For DM (dx. E11.40), Disp: 6 mL, Rfl: 0   gabapentin (NEURONTIN) 300 MG capsule, TAKE 1 CAPSULE (300 MG TOTAL) BY MOUTH 2 (TWO) TIMES DAILY., Disp: 90 capsule, Rfl: 1   glipiZIDE (GLUCOTROL) 10 MG tablet, TAKE 1 TABLET BY MOUTH TWICE A DAY BEFORE MEALS, Disp: 180 tablet, Rfl: 0   JARDIANCE 25 MG TABS tablet, TAKE 1 TABLET BY MOUTH EVERY DAY BEFORE BREAKFAST, Disp: 90 tablet, Rfl: 0  lisinopril (ZESTRIL) 40 MG tablet, TAKE 1 TABLET BY MOUTH EVERY DAY, Disp: 90 tablet, Rfl: 0   metFORMIN (GLUCOPHAGE) 1000 MG tablet, Take 1 tablet (1,000 mg total) by mouth 2 (two) times daily with a meal., Disp: 180 tablet, Rfl: 0   omeprazole (PRILOSEC) 20 MG capsule, Take 1 capsule (20 mg total) by mouth daily., Disp: 90 capsule, Rfl: 3  EXAM:  VITALS per patient if applicable:  GENERAL: alert, oriented, appears well and in no acute distress  HEENT: atraumatic, conjunttiva clear, no obvious abnormalities on inspection of external nose and ears  NECK: normal movements of the head and neck  LUNGS: on inspection no signs of respiratory distress, breathing rate appears normal, no obvious gross SOB, gasping or wheezing  CV: no obvious cyanosis  MS: moves all visible  extremities without noticeable abnormality  PSYCH/NEURO: pleasant and cooperative, no obvious depression or anxiety, speech and thought processing grossly intact  ASSESSMENT AND PLAN:  Discussed the following assessment and plan:  1. Type 2 diabetes mellitus with diabetic neuropathy, without long-term current use of insulin (HCC) -We will refill her medications but she will need to come to the office for A1c check for further refills. - metFORMIN (GLUCOPHAGE) 1000 MG tablet; Take 1 tablet (1,000 mg total) by mouth 2 (two) times daily with a meal.  Dispense: 180 tablet; Refill: 0 - glipiZIDE (GLUCOTROL) 10 MG tablet; Take 1 tablet (10 mg total) by mouth 2 (two) times daily before a meal.  Dispense: 180 tablet; Refill: 0 - Dulaglutide (TRULICITY) 3 MG/0.5ML SOPN; Inject 3 mg as directed once a week. For DM (dx. E11.40)  Dispense: 6 mL; Refill: 0      I discussed the assessment and treatment plan with the patient. The patient was provided an opportunity to ask questions and all were answered. The patient agreed with the plan and demonstrated an understanding of the instructions.   The patient was advised to call back or seek an in-person evaluation if the symptoms worsen or if the condition fails to improve as anticipated.   Shirline Frees, NP

## 2021-07-25 ENCOUNTER — Other Ambulatory Visit: Payer: Self-pay | Admitting: Adult Health

## 2021-07-25 DIAGNOSIS — I10 Essential (primary) hypertension: Secondary | ICD-10-CM

## 2021-08-02 ENCOUNTER — Other Ambulatory Visit: Payer: Self-pay | Admitting: Adult Health

## 2021-08-02 DIAGNOSIS — E1140 Type 2 diabetes mellitus with diabetic neuropathy, unspecified: Secondary | ICD-10-CM

## 2021-10-15 ENCOUNTER — Encounter: Payer: Self-pay | Admitting: Adult Health

## 2021-10-15 ENCOUNTER — Ambulatory Visit (INDEPENDENT_AMBULATORY_CARE_PROVIDER_SITE_OTHER): Payer: BC Managed Care – PPO | Admitting: Adult Health

## 2021-10-15 VITALS — BP 130/80 | HR 113 | Temp 97.1°F | Ht 67.5 in | Wt 176.0 lb

## 2021-10-15 DIAGNOSIS — Z23 Encounter for immunization: Secondary | ICD-10-CM

## 2021-10-15 DIAGNOSIS — I10 Essential (primary) hypertension: Secondary | ICD-10-CM | POA: Diagnosis not present

## 2021-10-15 DIAGNOSIS — E1140 Type 2 diabetes mellitus with diabetic neuropathy, unspecified: Secondary | ICD-10-CM

## 2021-10-15 LAB — POCT GLYCOSYLATED HEMOGLOBIN (HGB A1C): Hemoglobin A1C: 10.1 % — AB (ref 4.0–5.6)

## 2021-10-15 MED ORDER — EMPAGLIFLOZIN 25 MG PO TABS: ORAL_TABLET | ORAL | 0 refills | Status: DC

## 2021-10-15 MED ORDER — METFORMIN HCL 1000 MG PO TABS: 1000.0000 mg | ORAL_TABLET | Freq: Two times a day (BID) | ORAL | 0 refills | Status: DC

## 2021-10-15 MED ORDER — GLIPIZIDE 10 MG PO TABS: 10.0000 mg | ORAL_TABLET | Freq: Two times a day (BID) | ORAL | 0 refills | Status: DC

## 2021-10-15 NOTE — Patient Instructions (Signed)
Your A1c was 10.1 - this is up from 9.5   I am going to have you stop Trulicity and start Mounjaro.   Set up a virtual visit in one month to see how you are doing

## 2021-10-15 NOTE — Progress Notes (Signed)
Subjective:    Patient ID: Breanna Hall, female    DOB: [birth year], 60 y.o.   MRN: [id number]  HPI 60 year old female who  has a past medical history of Bowel obstruction (HCC), Colon polyp, Diabetes mellitus, Endometriosis, Frozen shoulder, and Hypertension.  She presents to the office today for follow-up regarding diabetes mellitus and HTN .  She has been uncontrolled for quite a while.  She has not been using the [place] system despite having supplies.Last A1c was 9.5.  She has been hesitant to add insulin to lower her blood sugars despite knowing her risks. She has been eating " horribly" and not exercising. Reports she is having to take care of her mom who is in poor health  She is currently maintained on Trulicity 3 mg weekly, glipizide 10 mg twice daily,, Jardiance 25 mg and metformin 1000 mg twice daily.   Lab Results  Component Value Date   HGBA1C 9.5 (H) 02/12/2021   Blood pressure is controlled with Norvasc 10 mg daily and lisinopril 40 mg daily.  She denies dizziness, lightheadedness, chest pain, shortness of breath BP Readings from Last 3 Encounters:  02/12/21 118/80  10/29/20 128/70  07/16/20 112/78     Review of Systems See HPI   Past Medical History:  Diagnosis Date   Bowel obstruction (HCC)    Colon polyp    Diabetes mellitus    Endometriosis    Frozen shoulder    Hypertension    high blood pressure readings    Social History   Socioeconomic History   Marital status: Single    Spouse name: Not on file   Number of children: Not on file   Years of education: Not on file   Highest education level: Not on file  Occupational History   Not on file  Tobacco Use   Smoking status: Never   Smokeless tobacco: Never  Substance and Sexual Activity   Alcohol use: No   Drug use: No   Sexual activity: Not on file  Other Topics Concern   Not on file  Social History Narrative   Works in a call center answering phose   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   Social  Determinants of Health   Financial Resource Strain: Not on file  Food Insecurity: Not on file  Transportation Needs: Not on file  Physical Activity: Not on file  Stress: Not on file  Social Connections: Not on file  Intimate Partner Violence: Not on file    Past Surgical History:  Procedure Laterality Date   CLAVICLE EXCISION  2012   COLECTOMY  Feb 2010   sigmoid   MOHS SURGERY Left 11/28/2018   Basal Cell Carcinoma- infiltrating/Left Dorsum of nose   TONSILLECTOMY AND ADENOIDECTOMY  1989   VENTRAL HERNIA REPAIR  09-30-09    Family History  Problem Relation Age of Onset   Breast cancer Mother 42       Smoker   Elevated Lipids Mother    Hypertension Mother    Diabetes Mother    Elevated Lipids Father    Colon cancer Maternal Grandmother    Lung cancer Maternal Grandmother    Lung cancer Maternal Grandfather        Smoker   Brain cancer Maternal Grandfather    Uterine cancer Paternal Grandmother    Diabetes Paternal Grandfather     Allergies  Allergen Reactions   Penicillins    Sulfa Antibiotics     Other reaction(s): Unknown  Current Outpatient Medications on File Prior to Visit  Medication Sig Dispense Refill   amLODipine (NORVASC) 10 MG tablet TAKE 1 TABLET BY MOUTH EVERY DAY 90 tablet 3   atorvastatin (LIPITOR) 80 MG tablet Take 1 tablet (80 mg total) by mouth daily. 90 tablet 3   Continuous Blood Gluc Sensor (FREESTYLE LIBRE 2 SENSOR) MISC Use libre app 6 each 3   Dulaglutide (TRULICITY) 3 MG/0.5ML SOPN Inject 3 mg as directed once a week. For DM (dx. E11.40) 6 mL 0   gabapentin (NEURONTIN) 300 MG capsule TAKE 1 CAPSULE (300 MG TOTAL) BY MOUTH 2 (TWO) TIMES DAILY. 90 capsule 1   glipiZIDE (GLUCOTROL) 10 MG tablet Take 1 tablet (10 mg total) by mouth 2 (two) times daily before a meal. 180 tablet 0   JARDIANCE 25 MG TABS tablet TAKE 1 TABLET BY MOUTH EVERY DAY BEFORE BREAKFAST 90 tablet 0   lisinopril (ZESTRIL) 40 MG tablet TAKE 1 TABLET BY MOUTH EVERY DAY 90  tablet 0   metFORMIN (GLUCOPHAGE) 1000 MG tablet Take 1 tablet (1,000 mg total) by mouth 2 (two) times daily with a meal. 180 tablet 0   omeprazole (PRILOSEC) 20 MG capsule Take 1 capsule (20 mg total) by mouth daily. 90 capsule 3   No current facility-administered medications on file prior to visit.    LMP 11/15/2010 (Approximate)       Objective:   Physical Exam Vitals and nursing note reviewed.  Cardiovascular:     Rate and Rhythm: Normal rate and regular rhythm.     Pulses: Normal pulses.     Heart sounds: Normal heart sounds.  Pulmonary:     Effort: Pulmonary effort is normal.     Breath sounds: Normal breath sounds.  Skin:    General: Skin is warm and dry.  Neurological:     General: No focal deficit present.     Mental Status: She is oriented to person, place, and time.  Psychiatric:        Mood and Affect: Mood normal.        Behavior: Behavior normal.        Thought Content: Thought content normal.        Judgment: Judgment normal.      Assessment & Plan:  1. Type 2 diabetes mellitus with diabetic neuropathy, without long-term current use of insulin (HCC)  - POC HgB A1c- 10.1 - increased  -Given, refuse insulin.  She needs to start using her libre system.  We will switch from Trulicity to [facility] 2.5 mg, follow up in one month virtually  - glipiZIDE (GLUCOTROL) 10 MG tablet; Take 1 tablet (10 mg total) by mouth 2 (two) times daily before a meal.  Dispense: 180 tablet; Refill: 0 - metFORMIN (GLUCOPHAGE) 1000 MG tablet; Take 1 tablet (1,000 mg total) by mouth 2 (two) times daily with a meal.  Dispense: 180 tablet; Refill: 0 - empagliflozin (JARDIANCE) 25 MG TABS tablet; TAKE 1 TABLET BY MOUTH EVERY DAY BEFORE BREAKFAST  Dispense: 90 tablet; Refill: 0  2. Essential hypertension - Controlled. No change in medications   3. Need for immunization against influenza  - Flu Vaccine QUAD 42mo+IM (Fluarix, Fluzone & Alfiuria Quad PF)  Shirline Frees, NP

## 2021-10-29 ENCOUNTER — Other Ambulatory Visit: Payer: Self-pay | Admitting: Adult Health

## 2021-10-29 DIAGNOSIS — I10 Essential (primary) hypertension: Secondary | ICD-10-CM

## 2021-11-17 ENCOUNTER — Telehealth (INDEPENDENT_AMBULATORY_CARE_PROVIDER_SITE_OTHER): Payer: BC Managed Care – PPO | Admitting: Adult Health

## 2021-11-17 ENCOUNTER — Encounter: Payer: Self-pay | Admitting: Adult Health

## 2021-11-17 VITALS — HR 101 | Ht 67.5 in | Wt 175.0 lb

## 2021-11-17 DIAGNOSIS — E1140 Type 2 diabetes mellitus with diabetic neuropathy, unspecified: Secondary | ICD-10-CM

## 2021-11-17 MED ORDER — TIRZEPATIDE 5 MG/0.5ML ~~LOC~~ SOAJ
5.0000 mg | SUBCUTANEOUS | 0 refills | Status: DC
Start: 2021-11-17 — End: 2021-12-01

## 2021-11-17 NOTE — Progress Notes (Signed)
Virtual Visit via Telephone Note  I connected with Breanna Hall on 11/17/21 at  4:00 PM EST by telephone and verified that I am speaking with the correct person using two identifiers.   I discussed the limitations, risks, security and privacy concerns of performing an evaluation and management service by telephone and the availability of in person appointments. I also discussed with the patient that there may be a patient responsible charge related to this service. The patient expressed understanding and agreed to proceed.  Location patient: home Location provider: work or home office Participants present for the call: patient, provider Patient did not have a visit in the prior 7 days to address this/these issue(s).   History of Present Illness: 61 year old female who  has a past medical history of Bowel obstruction (HCC), Colon polyp, Diabetes mellitus, Endometriosis, Frozen shoulder, and Hypertension.  She is being evaluated today for one month follow up regarding DM. Her A1c has been uncontrolled for sometime now. We switched her from Trulicity to Sebastian a month ago. She reports that she is tolerated Mounjaro much better than trulicity and does not feel as sick. She has not had any side effects      Observations/Objective: Patient sounds cheerful and well on the phone. I do not appreciate any SOB. Speech and thought processing are grossly intact. Patient reported vitals:  Assessment and Plan: 1. Type 2 diabetes mellitus with diabetic neuropathy, without long-term current use of insulin (HCC) - Will increase her dose of Mounjaro to 5 mg  - tirzepatide (MOUNJARO) 5 MG/0.5ML Pen; Inject 5 mg into the skin once a week.  Dispense: 2 mL; Refill: 0   Follow Up Instructions:  I did not refer this patient for an OV in the next 24 hours for this/these issue(s).  I discussed the assessment and treatment plan with the patient. The patient was provided an opportunity to ask  questions and all were answered. The patient agreed with the plan and demonstrated an understanding of the instructions.   The patient was advised to call back or seek an in-person evaluation if the symptoms worsen or if the condition fails to improve as anticipated.  I provided 15 minutes of non-face-to-face time during this encounter.   Shirline Frees, NP

## 2021-11-27 ENCOUNTER — Telehealth: Payer: Self-pay | Admitting: Adult Health

## 2021-11-27 NOTE — Telephone Encounter (Signed)
Please advise 

## 2021-11-27 NOTE — Telephone Encounter (Signed)
Patient stated that [employer]BS is saying that they cannot cover tirzepatide [facility]) 5 MG/0.5ML Pen [id number]  because is it not in their familiarly. Patient wants to know if Tommi Rumps could prescribe her something similar that BCBS would be able to cover.  Patient could be contacted a [phone].  Please advise.

## 2021-12-01 ENCOUNTER — Other Ambulatory Visit: Payer: Self-pay | Admitting: Adult Health

## 2021-12-01 MED ORDER — TRULICITY 4.5 MG/0.5ML ~~LOC~~ SOAJ: 4.5000 mg | SUBCUTANEOUS | 2 refills | Status: DC

## 2021-12-02 NOTE — Telephone Encounter (Signed)
Left message to return phone call.

## 2021-12-03 NOTE — Telephone Encounter (Signed)
Patient called back. I let her know Breanna Hall was unavailable now and she would call back when she was. Patient verbalized understanding.       Please advise

## 2021-12-03 NOTE — Telephone Encounter (Signed)
Patient notified of update  and verbalized understanding. 

## 2022-01-27 ENCOUNTER — Other Ambulatory Visit: Payer: Self-pay | Admitting: Adult Health

## 2022-01-27 DIAGNOSIS — I10 Essential (primary) hypertension: Secondary | ICD-10-CM

## 2022-01-27 NOTE — Telephone Encounter (Signed)
Patient need to schedule an ov for more refills. 

## 2022-02-01 ENCOUNTER — Other Ambulatory Visit: Payer: Self-pay | Admitting: Adult Health

## 2022-02-01 DIAGNOSIS — E1140 Type 2 diabetes mellitus with diabetic neuropathy, unspecified: Secondary | ICD-10-CM

## 2022-02-04 ENCOUNTER — Other Ambulatory Visit: Payer: Self-pay | Admitting: Adult Health

## 2022-02-04 DIAGNOSIS — I10 Essential (primary) hypertension: Secondary | ICD-10-CM

## 2022-02-10 ENCOUNTER — Ambulatory Visit (INDEPENDENT_AMBULATORY_CARE_PROVIDER_SITE_OTHER): Payer: BC Managed Care – PPO | Admitting: Adult Health

## 2022-02-10 VITALS — BP 130/80 | HR 96 | Temp 98.6°F | Ht 67.5 in | Wt 176.0 lb

## 2022-02-10 DIAGNOSIS — E1140 Type 2 diabetes mellitus with diabetic neuropathy, unspecified: Secondary | ICD-10-CM | POA: Diagnosis not present

## 2022-02-10 DIAGNOSIS — I10 Essential (primary) hypertension: Secondary | ICD-10-CM

## 2022-02-10 LAB — POCT GLYCOSYLATED HEMOGLOBIN (HGB A1C): Hemoglobin A1C: 10.3 % — AB (ref 4.0–5.6)

## 2022-02-10 MED ORDER — TRULICITY 4.5 MG/0.5ML ~~LOC~~ SOAJ: 4.5000 mg | SUBCUTANEOUS | 0 refills | Status: AC

## 2022-02-10 MED ORDER — EMPAGLIFLOZIN 25 MG PO TABS: ORAL_TABLET | ORAL | 0 refills | Status: DC

## 2022-02-10 MED ORDER — GLIPIZIDE 10 MG PO TABS: 10.0000 mg | ORAL_TABLET | Freq: Two times a day (BID) | ORAL | 0 refills | Status: DC

## 2022-02-10 MED ORDER — INSULIN DEGLUDEC 100 UNIT/ML ~~LOC~~ SOPN: 10.0000 [IU] | PEN_INJECTOR | Freq: Every day | SUBCUTANEOUS | 0 refills | Status: AC

## 2022-02-10 MED ORDER — PEN NEEDLES 32G X 4 MM MISC: 3 refills | Status: DC

## 2022-02-10 MED ORDER — METFORMIN HCL 1000 MG PO TABS: 1000.0000 mg | ORAL_TABLET | Freq: Two times a day (BID) | ORAL | 0 refills | Status: DC

## 2022-02-10 NOTE — Progress Notes (Signed)
? ?Subjective:  ? ? Patient ID: Breanna SimmondsJan Rachelle Hall, female    DOB: 24-Jun-1961, 61 y.o.   MRN: [id number] ? ?HPI ?61 year old female who  has a past medical history of Bowel obstruction (HCC), Colon polyp, Diabetes mellitus, Endometriosis, Frozen shoulder, and Hypertension. ? ?DM type 2 -hold oral in a while 5 mg weekly, glipizide 10 mg twice daily, Jardiance 25 mg daily, metformin 1000 mg twice daily.  She has nightly refused insulin in the past.  Her diet and lack of exercise have been impeding factor on glucose control.Is not using the [place] [facility] [place] despite having supplies  ?Lab Results  ?Component Value Date  ? HGBA1C 10.1 (A) 10/15/2021  ? ?HTN -controlled with Norvasc 10 mg daily lisinopril 40 mg daily.  He denies dizziness, lightheadedness, chest pain, shortness of breath ?BP Readings from Last 3 Encounters:  ?02/10/22 130/80  ?10/15/21 130/80  ?02/12/21 118/80  ? ?Review of Systems  ?Constitutional: Negative.   ?HENT: Negative.    ?Eyes: Negative.   ?Respiratory: Negative.    ?Cardiovascular: Negative.   ?Gastrointestinal: Negative.   ?Endocrine: Negative.   ?Genitourinary: Negative.   ?Musculoskeletal: Negative.   ?Skin: Negative.   ?Allergic/Immunologic: Negative.   ?Neurological: Negative.   ?Hematological: Negative.   ?Psychiatric/Behavioral: Negative.    ? ?Past Medical History:  ?Diagnosis Date  ? Bowel obstruction (HCC)   ? Colon polyp   ? Diabetes mellitus   ? Endometriosis   ? Frozen shoulder   ? Hypertension   ? high blood pressure readings  ? ? ?Social History  ? ?Socioeconomic History  ? Marital status: Single  ?  Spouse name: Not on file  ? Number of children: Not on file  ? Years of education: Not on file  ? Highest education level: Not on file  ?Occupational History  ? Not on file  ?Tobacco Use  ? Smoking status: Never  ? Smokeless tobacco: Never  ?Substance and Sexual Activity  ? Alcohol use: No  ? Drug use: No  ? Sexual activity: Not on file  ?Other Topics Concern  ? Not on file  ?Social  History Narrative  ? Works in a call center answering phose  ? Divorced  ? Kids  ? ?Social Determinants of Health  ? ?Financial Resource Strain: Not on file  ?Food Insecurity: Not on file  ?Transportation Needs: Not on file  ?Physical Activity: Not on file  ?Stress: Not on file  ?Social Connections: Not on file  ?Intimate Partner Violence: Not on file  ? ? ?Past Surgical History:  ?Procedure Laterality Date  ? CLAVICLE EXCISION  2012  ? COLECTOMY  Feb 2010  ? sigmoid  ? MOHS SURGERY Left 11/28/2018  ? Basal Cell Carcinoma- infiltrating/Left Dorsum of nose  ? TONSILLECTOMY AND ADENOIDECTOMY  1989  ? VENTRAL HERNIA REPAIR  09-30-09  ? ? ?Family History  ?Problem Relation Age of Onset  ? Breast cancer Mother 2940  ?     Smoker  ? Elevated Lipids Mother   ? Hypertension Mother   ? Diabetes Mother   ? Elevated Lipids Father   ? Colon cancer Maternal Grandmother   ? Lung cancer Maternal Grandmother   ? Lung cancer Maternal Grandfather   ?     Smoker  ? Brain cancer Maternal Grandfather   ? Uterine cancer Paternal Grandmother   ? Diabetes Paternal Grandfather   ? ? ?Allergies  ?Allergen Reactions  ? Penicillins   ? Sulfa Antibiotics Other (See Comments)  ?  Other reaction(s): Unknown  ? ? ?Current Outpatient Medications on File Prior to Visit  ?Medication Sig Dispense Refill  ? amLODipine (NORVASC) 10 MG tablet Take 1 tablet (10 mg total) by mouth daily. KEEP APPT WITH DOCTOR FOR FUTURE REFILLS 90 tablet 0  ? atorvastatin (LIPITOR) 80 MG tablet Take 1 tablet (80 mg total) by mouth daily. 90 tablet 3  ? Continuous Blood Gluc Sensor (FREESTYLE LIBRE 2 SENSOR) MISC Use libre app 6 each 3  ? Dulaglutide (TRULICITY) 4.5 MG/0.5ML SOPN Inject 4.5 mg as directed once a week. 2 mL 2  ? empagliflozin (JARDIANCE) 25 MG TABS tablet TAKE 1 TABLET BY MOUTH EVERY DAY BEFORE BREAKFAST 90 tablet 0  ? gabapentin (NEURONTIN) 300 MG capsule TAKE 1 CAPSULE (300 MG TOTAL) BY MOUTH 2 (TWO) TIMES DAILY. 90 capsule 1  ? glipiZIDE (GLUCOTROL) 10 MG  tablet Take 1 tablet (10 mg total) by mouth 2 (two) times daily before a meal. 180 tablet 0  ? lisinopril (ZESTRIL) 40 MG tablet TAKE 1 TABLET BY MOUTH EVERY DAY 90 tablet 3  ? metFORMIN (GLUCOPHAGE) 1000 MG tablet Take 1 tablet (1,000 mg total) by mouth 2 (two) times daily with a meal. 180 tablet 0  ? omeprazole (PRILOSEC) 20 MG capsule Take 1 capsule (20 mg total) by mouth daily. 90 capsule 3  ? ?No current facility-administered medications on file prior to visit.  ? ? ?BP 130/80   Pulse 96   Temp 98.6 ?F (37 ?C) (Oral)   Ht 5' 7.5" (1.715 m)   Wt 176 lb (79.8 kg)   LMP 11/15/2010 (Approximate)   SpO2 99%   BMI 27.16 kg/m?  ? ? ?   ?Objective:  ? Physical Exam ?Vitals and nursing note reviewed.  ?Constitutional:   ?   General: She is not in acute distress. ?   Appearance: Normal appearance. She is well-developed. She is not ill-appearing.  ?HENT:  ?   Head: Normocephalic and atraumatic.  ?Neck:  ?   Thyroid: No thyromegaly.  ?   Vascular: No carotid bruit.  ?   Trachea: No tracheal deviation.  ?Cardiovascular:  ?   Rate and Rhythm: Normal rate and regular rhythm.  ?   Pulses: Normal pulses.  ?   Heart sounds: Normal heart sounds. No murmur heard. ?  No friction rub. No gallop.  ?Pulmonary:  ?   Effort: Pulmonary effort is normal. No respiratory distress.  ?   Breath sounds: Normal breath sounds. No stridor. No wheezing, rhonchi or rales.  ?Chest:  ?   Chest wall: No tenderness.  ?Abdominal:  ?   General: Abdomen is flat. Bowel sounds are normal. There is no distension.  ?   Palpations: Abdomen is soft. There is no mass.  ?   Tenderness: There is no abdominal tenderness. There is no right CVA tenderness, left CVA tenderness, guarding or rebound.  ?   Hernia: No hernia is present.  ?Musculoskeletal:  ?   Cervical back: Normal range of motion and neck supple.  ?Lymphadenopathy:  ?   Cervical: No cervical adenopathy.  ?Skin: ?   General: Skin is warm and dry.  ?   Capillary Refill: Capillary refill takes less  than 2 seconds.  ?   Coloration: Skin is not pale.  ?   Findings: No bruising, erythema, lesion or rash.  ?Neurological:  ?   General: No focal deficit present.  ?   Mental Status: She is alert and oriented to person, place, and time.  ?  Cranial Nerves: No cranial nerve deficit.  ?   Sensory: No sensory deficit.  ?   Motor: No weakness.  ?   Coordination: Coordination normal.  ?   Gait: Gait normal.  ?   Deep Tendon Reflexes: Reflexes normal.  ?Psychiatric:     ?   Mood and Affect: Mood normal.     ?   Behavior: Behavior normal.     ?   Thought Content: Thought content normal.     ?   Judgment: Judgment normal.  ? ?   ?Assessment & Plan:  ?1. Essential hypertension ?- Well controlled.  ?- No change in medications  ? ?2. Type 2 diabetes mellitus with diabetic neuropathy, without long-term current use of insulin (HCC) ? ?- POC HgB A1c- 10.3  ?- We will start her on Tresiba 10 units daily  ?- Needs to do better with lifestyle modifications  ?- Follow up in 3 months  ?- insulin degludec (TRESIBA) 100 UNIT/ML FlexTouch Pen; Inject 10 Units into the skin daily.  Dispense: 9 mL; Refill: 0 ?- metFORMIN (GLUCOPHAGE) 1000 MG tablet; Take 1 tablet (1,000 mg total) by mouth 2 (two) times daily with a meal.  Dispense: 180 tablet; Refill: 0 ?- empagliflozin (JARDIANCE) 25 MG TABS tablet; TAKE 1 TABLET BY MOUTH EVERY DAY BEFORE BREAKFAST  Dispense: 90 tablet; Refill: 0 ?- Dulaglutide (TRULICITY) 4.5 MG/0.5ML SOPN; Inject 4.5 mg as directed once a week.  Dispense: 6 mL; Refill: 0 ?- glipiZIDE (GLUCOTROL) 10 MG tablet; Take 1 tablet (10 mg total) by mouth 2 (two) times daily before a meal.  Dispense: 180 tablet; Refill: 0 ? ?Shirline Frees, NP ? ?

## 2022-03-24 ENCOUNTER — Other Ambulatory Visit: Payer: Self-pay | Admitting: Adult Health

## 2022-05-06 ENCOUNTER — Other Ambulatory Visit: Payer: Self-pay | Admitting: Adult Health

## 2022-05-06 DIAGNOSIS — I10 Essential (primary) hypertension: Secondary | ICD-10-CM

## 2022-05-10 ENCOUNTER — Other Ambulatory Visit: Payer: Self-pay | Admitting: Adult Health

## 2022-05-10 DIAGNOSIS — E1140 Type 2 diabetes mellitus with diabetic neuropathy, unspecified: Secondary | ICD-10-CM

## 2022-06-21 ENCOUNTER — Telehealth: Payer: Self-pay | Admitting: Pharmacist

## 2022-06-21 NOTE — Progress Notes (Signed)
[facility])  [facility] Quality Pharm[facility] Team    06/21/2022  Kit Brubacher 06/12/1961 [id number]  Reason for referral: Medication Review  Referral source:  BCBS Diabetes Target Report Current insurance: [facility]  PMHx includes but not limited to:  type 2 diabetes, hypertension, hyperlipidemia  Outreach:  Successful telephone call with patient.  HIPAA identifiers verified.   Subjective:  Patient was on the BCBS Diabetes Target report.  Her last HgA1c was 10.3%    Objective: The 10-year ASCVD risk score (Arnett DK, et al., 2019) is: 10.6%   Values used to calculate the score:     Age: 61 years     Sex: Female     Is Non-Hispanic African American: No     Diabetic: Yes     Tobacco smoker: No     Systolic Blood Pressure: 130 mmHg     Is BP treated: Yes     HDL Cholesterol: 49.4 mg/dL     Total Cholesterol: 211 mg/dL  Lab Results  Component Value Date   CREATININE 0.60 02/12/2021   CREATININE 0.68 11/01/2019   CREATININE 0.76 08/08/2019    Lab Results  Component Value Date   HGBA1C 10.3 (A) 02/10/2022    Lipid Panel     Component Value Date/Time   CHOL 211 (H) 02/12/2021 0836   TRIG 327.0 (H) 02/12/2021 0836   HDL 49.40 02/12/2021 0836   CHOLHDL 4 02/12/2021 0836   VLDL 65.4 (H) 02/12/2021 0836   LDLCALC 71 10/27/2018 0837   LDLDIRECT 100.0 02/12/2021 0836    BP Readings from Last 3 Encounters:  02/10/22 130/80  10/15/21 130/80  02/12/21 118/80    Allergies  Allergen Reactions   Penicillins    Sulfa Antibiotics Other (See Comments)    Other reaction(s): Unknown    Medications Reviewed Today     Reviewed by Sherrin Daisy, CMA (Certified Medical Assistant) on 02/10/22 at [phone]  Med List Status: <None>   Medication Order Taking? Sig Documenting Provider Last Dose Status Informant  amLODipine (NORVASC) 10 MG tablet [id number] Yes Take 1 tablet (10 mg total) by mouth daily. KEEP APPT WITH DOCTOR FOR FUTURE REFILLS  Nafziger, Kandee Keen, NP Taking Active   atorvastatin (LIPITOR) 80 MG tablet [id number] Yes Take 1 tablet (80 mg total) by mouth daily. Shirline Frees, NP Taking Active   Continuous Blood Gluc Sensor (FREESTYLE LIBRE 2 SENSOR) MISC [id number] Yes Use libre app Nafziger, Kandee Keen, NP Taking Active   Dulaglutide (TRULICITY) 4.5 MG/0.5ML SOPN [id number] Yes Inject 4.5 mg as directed once a week. Nafziger, Kandee Keen, NP Taking Active   empagliflozin (JARDIANCE) 25 MG TABS tablet [id number] Yes TAKE 1 TABLET BY MOUTH EVERY DAY BEFORE BREAKFAST Nafziger, Kandee Keen, NP Taking Active   gabapentin (NEURONTIN) 300 MG capsule [id number] Yes TAKE 1 CAPSULE (300 MG TOTAL) BY MOUTH 2 (TWO) TIMES DAILY. Nafziger, Kandee Keen, NP Taking Active   glipiZIDE (GLUCOTROL) 10 MG tablet [id number] Yes Take 1 tablet (10 mg total) by mouth 2 (two) times daily before a meal. Nafziger, Kandee Keen, NP Taking Active   lisinopril (ZESTRIL) 40 MG tablet [id number] Yes TAKE 1 TABLET BY MOUTH EVERY DAY Nafziger, Kandee Keen, NP Taking Active   metFORMIN (GLUCOPHAGE) 1000 MG tablet [id number] Yes Take 1 tablet (1,000 mg total) by mouth 2 (two) times daily with a meal. Nafziger, Kandee Keen, NP Taking Active   omeprazole (PRILOSEC) 20 MG capsule [id number] Yes Take 1 capsule (20 mg total) by mouth daily. Shirline Frees, NP Taking Active  Medication Review Findings:  Patient was on the BCBS Diabetes Target report. Called patient HIPAA identifiers were obtained. Patient reported that she is taking Jardiance, Glipizide and Trulicity to manage her blood sugars. She said she did not start the insulin Evaristo Bury) as directed by her provider because her mother passed and she was not in the right headspace to start a new medication. Her last HgA1c was 10.3 in March of this year and has not been checked since then. Patient also does not have a follow up appointment scheduled.      Plan: Will route note to PCP.  Will follow-up in 2 months.  Beecher Mcardle, PharmD, BCACP [facility]  Clinical Pharmacist [phone]

## 2022-07-29 ENCOUNTER — Other Ambulatory Visit: Payer: Self-pay | Admitting: Adult Health

## 2022-07-29 DIAGNOSIS — I10 Essential (primary) hypertension: Secondary | ICD-10-CM

## 2022-08-10 ENCOUNTER — Other Ambulatory Visit: Payer: Self-pay | Admitting: Adult Health

## 2022-08-10 DIAGNOSIS — E1140 Type 2 diabetes mellitus with diabetic neuropathy, unspecified: Secondary | ICD-10-CM

## 2022-08-19 ENCOUNTER — Other Ambulatory Visit: Payer: Self-pay | Admitting: Adult Health

## 2022-08-19 DIAGNOSIS — E1140 Type 2 diabetes mellitus with diabetic neuropathy, unspecified: Secondary | ICD-10-CM

## 2022-09-02 ENCOUNTER — Other Ambulatory Visit: Payer: Self-pay | Admitting: Adult Health

## 2022-09-02 DIAGNOSIS — E1140 Type 2 diabetes mellitus with diabetic neuropathy, unspecified: Secondary | ICD-10-CM

## 2022-10-27 DIAGNOSIS — R197 Diarrhea, unspecified: Secondary | ICD-10-CM | POA: Diagnosis not present

## 2022-10-27 DIAGNOSIS — Z1211 Encounter for screening for malignant neoplasm of colon: Secondary | ICD-10-CM | POA: Diagnosis not present

## 2022-10-27 DIAGNOSIS — Z1231 Encounter for screening mammogram for malignant neoplasm of breast: Secondary | ICD-10-CM | POA: Diagnosis not present

## 2022-10-27 DIAGNOSIS — R194 Change in bowel habit: Secondary | ICD-10-CM | POA: Diagnosis not present

## 2022-10-27 LAB — HM MAMMOGRAPHY

## 2022-10-28 ENCOUNTER — Ambulatory Visit (INDEPENDENT_AMBULATORY_CARE_PROVIDER_SITE_OTHER): Payer: BC Managed Care – PPO | Admitting: Adult Health

## 2022-10-28 ENCOUNTER — Encounter: Payer: Self-pay | Admitting: Adult Health

## 2022-10-28 ENCOUNTER — Ambulatory Visit (INDEPENDENT_AMBULATORY_CARE_PROVIDER_SITE_OTHER): Payer: BC Managed Care – PPO

## 2022-10-28 VITALS — BP 110/80 | HR 108 | Temp 98.0°F | Ht 67.5 in | Wt 183.0 lb

## 2022-10-28 DIAGNOSIS — I10 Essential (primary) hypertension: Secondary | ICD-10-CM | POA: Diagnosis not present

## 2022-10-28 DIAGNOSIS — M25561 Pain in right knee: Secondary | ICD-10-CM | POA: Diagnosis not present

## 2022-10-28 DIAGNOSIS — E1140 Type 2 diabetes mellitus with diabetic neuropathy, unspecified: Secondary | ICD-10-CM

## 2022-10-28 DIAGNOSIS — G8929 Other chronic pain: Secondary | ICD-10-CM | POA: Diagnosis not present

## 2022-10-28 LAB — MICROALBUMIN / CREATININE URINE RATIO
Creatinine,U: 39.6 mg/dL
Microalb Creat Ratio: 1.8 mg/g (ref 0.0–30.0)
Microalb, Ur: <0.7 mg/dL (ref 0.0–1.9)

## 2022-10-28 LAB — POCT GLYCOSYLATED HEMOGLOBIN (HGB A1C): Hemoglobin A1C: 10 % — AB (ref 4.0–5.6)

## 2022-10-28 MED ORDER — PEN NEEDLES 32G X 4 MM MISC: 3 refills | Status: AC

## 2022-10-28 MED ORDER — FREESTYLE LIBRE 2 SENSOR MISC: 3 refills | Status: AC

## 2022-10-28 MED ORDER — INSULIN DEGLUDEC 100 UNIT/ML ~~LOC~~ SOPN: 15.0000 [IU] | PEN_INJECTOR | Freq: Every day | SUBCUTANEOUS | 2 refills | Status: AC

## 2022-10-28 NOTE — Progress Notes (Signed)
Subjective:    Patient ID: Breanna Hall, female    DOB: 10-19-1961, 61 y.o.   MRN: [id number]  HPI She presents to the office today for follow up regarding DM and HTN   She was last seen in the office 8 months ago   DM Type 2 -currently managed with Trulicity 4.5 mg weekly, glipizide 10 mg daily, Jardiance 25 mg daily, metformin 1000 mg twice daily and most recently 8 months ago was started on Tresiba but 10 units daily.  She does not exercise on a daily basis and does not follow a specific diet.  She has supplies for the [place] system but has refused to use it in the past. Today she reports that she would like to start using it to monitor her blood sugars.   Today she reports that she did not start her insulin until about three months ago.  She has not been taking Trulicity as it now causes nausea and she does not want to go back on it.   She has started to have diarrhea and was seen by GI yesterday and he advised to stop taking Metformin for a week to see if this clears up She is ready to see an endocrinologist down at the [facility].   Lab Results  Component Value Date   HGBA1C 10.3 (A) 02/10/2022   Wt Readings from Last 3 Encounters:  10/28/22 183 lb (83 kg)  02/10/22 176 lb (79.8 kg)  11/17/21 175 lb (79.4 kg)   Hypertension-managed with Norvasc 10 mg daily lisinopril 40 mg daily.  She denies dizziness, lightheadedness, chest pain, or shortness of breath BP Readings from Last 3 Encounters:  10/28/22 110/80  02/10/22 130/80  10/15/21 130/80   Chronic right knee pain -this has been an ongoing issue but seems to be getting worse.  She finds it difficult to get out of a seating position without having to push off on the chair rail.  [place]  Review of Systems  See HPI   Past Medical History:  Diagnosis Date   Bowel obstruction (HCC)    Colon polyp    Diabetes mellitus    Endometriosis    Frozen  shoulder    Hypertension    high blood pressure readings    Social History   Socioeconomic History   Marital status: Single    Spouse name: Not on file   Number of children: Not on file   Years of education: Not on file   Highest education level: Not on file  Occupational History   Not on file  Tobacco Use   Smoking status: Never   Smokeless tobacco: Never  Substance and Sexual Activity   Alcohol use: No   Drug use: No   Sexual activity: Not on file  Other Topics Concern   Not on file  Social History Narrative   Works in a call center answering phose   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   Social Determinants of Health   Financial Resource Strain: Not on file  Food Insecurity: Not on file  Transportation Needs: Not on file  Physical Activity: Not on file  Stress: Not on file  Social Connections: Not on file  Intimate Partner Violence: Not on file    Past Surgical History:  Procedure Laterality Date   CLAVICLE EXCISION  2012   COLECTOMY  Feb 2010   sigmoid   MOHS SURGERY Left 11/28/2018  Basal Cell Carcinoma- infiltrating/Left Dorsum of nose   TONSILLECTOMY AND ADENOIDECTOMY  1989   VENTRAL HERNIA REPAIR  09-30-09    Family History  Problem Relation Age of Onset   Breast cancer Mother [id number]       Smoker   Elevated Lipids Mother    Hypertension Mother    Diabetes Mother    Elevated Lipids Father    Colon cancer Maternal Grandmother    Lung cancer Maternal Grandmother    Lung cancer Maternal Grandfather        Smoker   Brain cancer Maternal Grandfather    Uterine cancer Paternal Grandmother    Diabetes Paternal Grandfather     Allergies  Allergen Reactions   Penicillins    Sulfa Antibiotics Other (See Comments)    Other reaction(s): Unknown    Current Outpatient Medications on File Prior to Visit  Medication Sig Dispense Refill   amLODipine (NORVASC) 10 MG tablet TAKE 1 TABLET (10 MG TOTAL) BY MOUTH DAILY. KEEP APPT WITH DOCTOR FOR FUTURE REFILLS 90 tablet 0    atorvastatin (LIPITOR) 80 MG tablet TAKE 1 TABLET BY MOUTH EVERY DAY 90 tablet 3   gabapentin (NEURONTIN) 300 MG capsule TAKE 1 CAPSULE (300 MG TOTAL) BY MOUTH 2 (TWO) TIMES DAILY. 90 capsule 1   glipiZIDE (GLUCOTROL) 10 MG tablet TAKE 1 TABLET (10 MG TOTAL) BY MOUTH TWICE A DAY BEFORE A MEAL 180 tablet 1   JARDIANCE 25 MG TABS tablet TAKE 1 TABLET BY MOUTH EVERY DAY BEFORE BREAKFAST 90 tablet 0   lisinopril (ZESTRIL) 40 MG tablet TAKE 1 TABLET BY MOUTH EVERY DAY 90 tablet 3   omeprazole (PRILOSEC) 20 MG capsule Take 1 capsule (20 mg total) by mouth daily. 90 capsule 3   metFORMIN (GLUCOPHAGE) 1000 MG tablet TAKE 1 TABLET (1,000 MG TOTAL) BY MOUTH TWICE A DAY WITH FOOD (Patient not taking: Reported on 10/28/2022) 180 tablet 1   No current facility-administered medications on file prior to visit.    BP 110/80   Pulse (!) 108   Temp 98 F (36.7 C) (Oral)   Ht 5' 7.5" (1.715 m)   Wt 183 lb (83 kg)   LMP 11/15/2010 (Approximate)   SpO2 97%   BMI 28.24 kg/m    Past Medical History:  Diagnosis Date   Bowel obstruction (HCC)    Colon polyp    Diabetes mellitus    Endometriosis    Frozen shoulder    Hypertension    high blood pressure readings    Social History   Socioeconomic History   Marital status: Single    Spouse name: Not on file   Number of children: Not on file   Years of education: Not on file   Highest education level: Not on file  Occupational History   Not on file  Tobacco Use   Smoking status: Never   Smokeless tobacco: Never  Substance and Sexual Activity   Alcohol use: No   Drug use: No   Sexual activity: Not on file  Other Topics Concern   Not on file  Social History Narrative   Works in a call center answering phose   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   Social Determinants of Health   Financial Resource Strain: Not on file  Food Insecurity: Not on file  Transportation Needs: Not on file  Physical Activity: Not on file  Stress: Not on file  Social Connections:  Not on file  Intimate Partner Violence: Not on file  Past Surgical History:  Procedure Laterality Date   CLAVICLE EXCISION  2012   COLECTOMY  Feb 2010   sigmoid   MOHS SURGERY Left 11/28/2018   Basal Cell Carcinoma- infiltrating/Left Dorsum of nose   TONSILLECTOMY AND ADENOIDECTOMY  1989   VENTRAL HERNIA REPAIR  09-30-09    Family History  Problem Relation Age of Onset   Breast cancer Mother 64       Smoker   Elevated Lipids Mother    Hypertension Mother    Diabetes Mother    Elevated Lipids Father    Colon cancer Maternal Grandmother    Lung cancer Maternal Grandmother    Lung cancer Maternal Grandfather        Smoker   Brain cancer Maternal Grandfather    Uterine cancer Paternal Grandmother    Diabetes Paternal Grandfather     Allergies  Allergen Reactions   Penicillins    Sulfa Antibiotics Other (See Comments)    Other reaction(s): Unknown    Current Outpatient Medications on File Prior to Visit  Medication Sig Dispense Refill   amLODipine (NORVASC) 10 MG tablet TAKE 1 TABLET (10 MG TOTAL) BY MOUTH DAILY. KEEP APPT WITH DOCTOR FOR FUTURE REFILLS 90 tablet 0   atorvastatin (LIPITOR) 80 MG tablet TAKE 1 TABLET BY MOUTH EVERY DAY 90 tablet 3   Continuous Blood Gluc Sensor (FREESTYLE LIBRE 2 SENSOR) MISC Use libre app 6 each 3   gabapentin (NEURONTIN) 300 MG capsule TAKE 1 CAPSULE (300 MG TOTAL) BY MOUTH 2 (TWO) TIMES DAILY. 90 capsule 1   glipiZIDE (GLUCOTROL) 10 MG tablet TAKE 1 TABLET (10 MG TOTAL) BY MOUTH TWICE A DAY BEFORE A MEAL 180 tablet 1   Insulin Pen Needle (PEN NEEDLES) 32G X 4 MM MISC Use with insulin pen 100 each 3   JARDIANCE 25 MG TABS tablet TAKE 1 TABLET BY MOUTH EVERY DAY BEFORE BREAKFAST 90 tablet 0   lisinopril (ZESTRIL) 40 MG tablet TAKE 1 TABLET BY MOUTH EVERY DAY 90 tablet 3   omeprazole (PRILOSEC) 20 MG capsule Take 1 capsule (20 mg total) by mouth daily. 90 capsule 3   metFORMIN (GLUCOPHAGE) 1000 MG tablet TAKE 1 TABLET (1,000 MG TOTAL) BY  MOUTH TWICE A DAY WITH FOOD (Patient not taking: Reported on 10/28/2022) 180 tablet 1   No current facility-administered medications on file prior to visit.    BP 110/80   Pulse (!) 108   Temp 98 F (36.7 C) (Oral)   Ht 5' 7.5" (1.715 m)   Wt 183 lb (83 kg)   LMP 11/15/2010 (Approximate)   SpO2 97%   BMI 28.24 kg/m       Objective:   Physical Exam Vitals and nursing note reviewed.  Constitutional:      Appearance: Normal appearance.  Cardiovascular:     Rate and Rhythm: Normal rate and regular rhythm.     Pulses: Normal pulses.     Heart sounds: Normal heart sounds.  Pulmonary:     Effort: Pulmonary effort is normal.     Breath sounds: Normal breath sounds.  Skin:    General: Skin is warm and dry.     Capillary Refill: Capillary refill takes less than 2 seconds.  Neurological:     General: No focal deficit present.     Mental Status: She is alert and oriented to person, place, and time.  Psychiatric:        Mood and Affect: Mood normal.  Behavior: Behavior normal.        Thought Content: Thought content normal.        Judgment: Judgment normal.       Assessment & Plan:  1. Essential hypertension - Well controlled. No changes in medications   2. Type 2 diabetes mellitus with diabetic neuropathy, without long-term current use of insulin (HCC) - does not want to add anything until she is seen by endocrinology.  - POC HgB A1c- 10.0 - Will increase her insulin to 15 units daily.  - Needs to monitor her blood sugar and make some extensive lifestyle modifications  - Referral to Endo placed - Continue with Jardiance  - Ambulatory referral to Endocrinology - Continuous Blood Gluc Sensor (FREESTYLE LIBRE 2 SENSOR) MISC; Use libre app  Dispense: 6 each; Refill: 3 - insulin degludec (TRESIBA) 100 UNIT/ML FlexTouch Pen; Inject 15 Units into the skin daily.  Dispense: 4.5 mL; Refill: 2 - Insulin Pen Needle (PEN NEEDLES) 32G X 4 MM MISC; Use with insulin pen  Dispense:  100 each; Refill: 3 - Microalbumin/Creatinine Ratio, Urine; Future - Microalbumin/Creatinine Ratio, Urine  3. Chronic pain of right knee - Will get xray of right knee today to see if she would benefit more from PT or ortho  consult  - DG Knee 1-2 Views Right; Future  Shirline Frees, NP

## 2022-11-01 ENCOUNTER — Other Ambulatory Visit: Payer: Self-pay | Admitting: Adult Health

## 2022-11-01 DIAGNOSIS — E1140 Type 2 diabetes mellitus with diabetic neuropathy, unspecified: Secondary | ICD-10-CM

## 2022-11-01 DIAGNOSIS — I10 Essential (primary) hypertension: Secondary | ICD-10-CM

## 2022-11-03 ENCOUNTER — Other Ambulatory Visit: Payer: Self-pay | Admitting: Adult Health

## 2022-11-03 DIAGNOSIS — G8929 Other chronic pain: Secondary | ICD-10-CM

## 2022-11-10 ENCOUNTER — Encounter: Payer: Self-pay | Admitting: Adult Health

## 2022-11-16 DIAGNOSIS — M25561 Pain in right knee: Secondary | ICD-10-CM | POA: Diagnosis not present

## 2022-11-16 DIAGNOSIS — M1711 Unilateral primary osteoarthritis, right knee: Secondary | ICD-10-CM | POA: Diagnosis not present

## 2022-11-16 DIAGNOSIS — M25551 Pain in right hip: Secondary | ICD-10-CM | POA: Diagnosis not present

## 2022-12-27 ENCOUNTER — Telehealth: Payer: Self-pay

## 2022-12-27 NOTE — Patient Outreach (Signed)
  Care Coordination   12/27/2022 Name: Breanna Hall MRN: [id number] DOB: [birth year]   Care Coordination Outreach Attempts:  An unsuccessful telephone outreach was attempted today to offer the patient information about available care coordination services as a benefit of their health plan.   Follow Up Plan:  Additional outreach attempts will be made to offer the patient care coordination information and services.   Encounter Outcome:  No Answer   Care Coordination Interventions:  No, not indicated    Jone Baseman, RN, MSN [facility] Care Management Coordinator Direct Line [phone]

## 2022-12-28 ENCOUNTER — Encounter: Payer: BC Managed Care – PPO | Admitting: Adult Health

## 2022-12-29 ENCOUNTER — Encounter: Payer: BC Managed Care – PPO | Admitting: Adult Health

## 2023-01-04 DIAGNOSIS — E1165 Type 2 diabetes mellitus with hyperglycemia: Secondary | ICD-10-CM | POA: Diagnosis not present

## 2023-01-04 DIAGNOSIS — I10 Essential (primary) hypertension: Secondary | ICD-10-CM | POA: Diagnosis not present

## 2023-01-04 DIAGNOSIS — Z794 Long term (current) use of insulin: Secondary | ICD-10-CM | POA: Diagnosis not present

## 2023-01-04 DIAGNOSIS — Z7984 Long term (current) use of oral hypoglycemic drugs: Secondary | ICD-10-CM | POA: Diagnosis not present

## 2023-01-26 ENCOUNTER — Other Ambulatory Visit: Payer: Self-pay | Admitting: Adult Health

## 2023-01-26 DIAGNOSIS — I10 Essential (primary) hypertension: Secondary | ICD-10-CM

## 2023-02-09 ENCOUNTER — Ambulatory Visit (INDEPENDENT_AMBULATORY_CARE_PROVIDER_SITE_OTHER): Payer: BC Managed Care – PPO | Admitting: Adult Health

## 2023-02-09 VITALS — BP 120/78 | HR 94 | Temp 97.5°F | Ht 67.5 in | Wt 188.0 lb

## 2023-02-09 DIAGNOSIS — E1140 Type 2 diabetes mellitus with diabetic neuropathy, unspecified: Secondary | ICD-10-CM

## 2023-02-09 DIAGNOSIS — E782 Mixed hyperlipidemia: Secondary | ICD-10-CM | POA: Diagnosis not present

## 2023-02-09 DIAGNOSIS — I10 Essential (primary) hypertension: Secondary | ICD-10-CM

## 2023-02-09 DIAGNOSIS — G629 Polyneuropathy, unspecified: Secondary | ICD-10-CM | POA: Diagnosis not present

## 2023-02-09 DIAGNOSIS — Z0000 Encounter for general adult medical examination without abnormal findings: Secondary | ICD-10-CM | POA: Diagnosis not present

## 2023-02-09 DIAGNOSIS — K2100 Gastro-esophageal reflux disease with esophagitis, without bleeding: Secondary | ICD-10-CM

## 2023-02-09 LAB — CBC WITH DIFFERENTIAL/PLATELET
Basophils Absolute: 0.1 10*3/uL (ref 0.0–0.1)
Basophils Relative: 1 % (ref 0.0–3.0)
Eosinophils Absolute: 0.2 10*3/uL (ref 0.0–0.7)
Eosinophils Relative: 1.7 % (ref 0.0–5.0)
HCT: 46 % (ref 36.0–46.0)
Hemoglobin: 15.2 g/dL — ABNORMAL HIGH (ref 12.0–15.0)
Lymphocytes Relative: 31.6 % (ref 12.0–46.0)
Lymphs Abs: 3.2 10*3/uL (ref 0.7–4.0)
MCHC: 33.1 g/dL (ref 30.0–36.0)
MCV: 80.5 fl (ref 78.0–100.0)
Monocytes Absolute: 0.6 10*3/uL (ref 0.1–1.0)
Monocytes Relative: 5.5 % (ref 3.0–12.0)
Neutro Abs: 6.2 10*3/uL (ref 1.4–7.7)
Neutrophils Relative %: 60.2 % (ref 43.0–77.0)
Platelets: 404 10*3/uL — ABNORMAL HIGH (ref 150.0–400.0)
RBC: 5.72 Mil/uL — ABNORMAL HIGH (ref 3.87–5.11)
RDW: 14.6 % (ref 11.5–15.5)
WBC: 10.3 10*3/uL (ref 4.0–10.5)

## 2023-02-09 LAB — LIPID PANEL
Cholesterol: 144 mg/dL (ref 0–200)
HDL: 71.6 mg/dL (ref >39.00)
LDL Cholesterol: 56 mg/dL (ref 0–99)
NonHDL: 71.91
Total CHOL/HDL Ratio: 2
Triglycerides: 79 mg/dL (ref 0.0–149.0)
VLDL: 15.8 mg/dL (ref 0.0–40.0)

## 2023-02-09 LAB — COMPREHENSIVE METABOLIC PANEL
ALT: 15 U/L (ref 0–35)
AST: 14 U/L (ref 0–37)
Albumin: 4.5 g/dL (ref 3.5–5.2)
Alkaline Phosphatase: 77 U/L (ref 39–117)
BUN: 27 mg/dL — ABNORMAL HIGH (ref 6–23)
CO2: 27 mEq/L (ref 19–32)
Calcium: 9.6 mg/dL (ref 8.4–10.5)
Chloride: 102 mEq/L (ref 96–112)
Creatinine, Ser: 0.6 mg/dL (ref 0.40–1.20)
GFR: 96.35 mL/min (ref >60.00)
Glucose, Bld: 139 mg/dL — ABNORMAL HIGH (ref 70–99)
Potassium: 3.9 mEq/L (ref 3.5–5.1)
Sodium: 139 mEq/L (ref 135–145)
Total Bilirubin: 0.5 mg/dL (ref 0.2–1.2)
Total Protein: 7.8 g/dL (ref 6.0–8.3)

## 2023-02-09 LAB — TSH: TSH: 1.13 u[IU]/mL (ref 0.35–5.50)

## 2023-02-09 LAB — MICROALBUMIN / CREATININE URINE RATIO
Creatinine,U: 57.4 mg/dL
Microalb Creat Ratio: 1.6 mg/g (ref 0.0–30.0)
Microalb, Ur: 0.9 mg/dL (ref 0.0–1.9)

## 2023-02-09 NOTE — Progress Notes (Signed)
Subjective:    Patient ID: Breanna Hall, female    DOB: [birth year], 62 y.o.   MRN: [id number]  HPI Patient presents for yearly preventative medicine examination. She is a pleasant 62 year old female who  has a past medical history of Bowel obstruction ([place]), Colon polyp, Diabetes mellitus, Endometriosis, Frozen shoulder, and Hypertension.  DM Type 2 - she isnow being seen by Endocrinology at the [facility]. She is currently managed with Metformin 500 mg ER daily, Glipizide 10 mg ER daily, Jardiance 25 mg daily, Tresiba 20 units daily and Ozempic 0.25 mg daily. She is using libre to monitor her blood sugars now and her 30 days average has been 156. She has cut out sodas. Her last A1c was 9.0 ( I do not have these records)   Diabetic Neuropathy - managed with gabapentin 300 mg BID  Hypertension-managed with Norvasc 10 mg daily lisinopril 40 mg daily.  She denies dizziness, lightheadedness, chest pain, or shortness of breath BP Readings from Last 3 Encounters:  02/09/23 120/78  10/28/22 110/80  02/10/22 130/80   Hyperlipidemia - managed with lipitor 80 mg. Denies myalgia or fatigue  Lab Results  Component Value Date   CHOL 211 (H) 02/12/2021   HDL 49.40 02/12/2021   LDLCALC 71 10/27/2018   LDLDIRECT 100.0 02/12/2021   TRIG 327.0 (H) 02/12/2021   CHOLHDL 4 02/12/2021   GERD - managed with prilosec 20 mg daily.    All immunizations and health maintenance protocols were reviewed with the patient and needed orders were placed.  Appropriate screening laboratory values were ordered for the patient including screening of hyperlipidemia, renal function and hepatic function.  Medication reconciliation,  past medical history, social history, problem list and allergies were reviewed in detail with the patient  Goals were established with regard to weight loss, exercise, and  diet in compliance with medications  She is going to have her colonoscopy done tomorrow.    She is also overdue  for mammogram and Pap.  She has a GYN but just has not scheduled her Pap   Review of Systems  Constitutional: Negative.   HENT: Negative.    Eyes: Negative.   Respiratory: Negative.    Cardiovascular: Negative.   Gastrointestinal: Negative.   Endocrine: Negative.   Genitourinary: Negative.   Musculoskeletal: Negative.   Skin: Negative.   Allergic/Immunologic: Negative.   Neurological: Negative.   Hematological: Negative.   Psychiatric/Behavioral: Negative.    All other systems reviewed and are negative.  Past Medical History:  Diagnosis Date   Bowel obstruction (HCC)    Colon polyp    Diabetes mellitus    Endometriosis    Frozen shoulder    Hypertension    high blood pressure readings    Social History   Socioeconomic History   Marital status: Single    Spouse name: Not on file   Number of children: Not on file   Years of education: Not on file   Highest education level: Not on file  Occupational History   Not on file  Tobacco Use   Smoking status: Never   Smokeless tobacco: Never  Substance and Sexual Activity   Alcohol use: No   Drug use: No   Sexual activity: Not on file  Other Topics Concern   Not on file  Social History Narrative   Works in a call center answering phose   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   Social Determinants of Health   Financial Resource Strain: Not on file  Food Insecurity: Not on file  Transportation Needs: Not on file  Physical Activity: Not on file  Stress: Not on file  Social Connections: Not on file  Intimate Partner Violence: Not on file    Past Surgical History:  Procedure Laterality Date   CLAVICLE EXCISION  2012   COLECTOMY  Feb 2010   sigmoid   MOHS SURGERY Left 11/28/2018   Basal Cell Carcinoma- infiltrating/Left Dorsum of nose   TONSILLECTOMY AND ADENOIDECTOMY  1989   VENTRAL HERNIA REPAIR  09-30-09    Family History  Problem Relation Age of Onset   Breast cancer Mother 75       Smoker   Elevated Lipids Mother     Hypertension Mother    Diabetes Mother    Elevated Lipids Father    Colon cancer Maternal Grandmother    Lung cancer Maternal Grandmother    Lung cancer Maternal Grandfather        Smoker   Brain cancer Maternal Grandfather    Uterine cancer Paternal Grandmother    Diabetes Paternal Grandfather     Allergies  Allergen Reactions   Penicillins    Sulfa Antibiotics Other (See Comments)    Other reaction(s): Unknown    Current Outpatient Medications on File Prior to Visit  Medication Sig Dispense Refill   amLODipine (NORVASC) 10 MG tablet TAKE 1 TABLET (10 MG TOTAL) BY MOUTH DAILY. KEEP APPT WITH DOCTOR FOR FUTURE REFILLS 90 tablet 0   atorvastatin (LIPITOR) 80 MG tablet TAKE 1 TABLET BY MOUTH EVERY DAY 90 tablet 3   Continuous Blood Gluc Sensor (FREESTYLE LIBRE 2 SENSOR) MISC Use libre app 6 each 3   gabapentin (NEURONTIN) 300 MG capsule TAKE 1 CAPSULE (300 MG TOTAL) BY MOUTH 2 (TWO) TIMES DAILY. 90 capsule 1   glipiZIDE (GLUCOTROL) 10 MG tablet TAKE 1 TABLET (10 MG TOTAL) BY MOUTH TWICE A DAY BEFORE A MEAL 180 tablet 1   Insulin Pen Needle (PEN NEEDLES) 32G X 4 MM MISC Use with insulin pen 100 each 3   JARDIANCE 25 MG TABS tablet TAKE 1 TABLET BY MOUTH EVERY DAY BEFORE BREAKFAST 90 tablet 0   lisinopril (ZESTRIL) 40 MG tablet TAKE 1 TABLET BY MOUTH EVERY DAY 90 tablet 3   metFORMIN (GLUCOPHAGE) 1000 MG tablet TAKE 1 TABLET (1,000 MG TOTAL) BY MOUTH TWICE A DAY WITH FOOD 180 tablet 1   omeprazole (PRILOSEC) 20 MG capsule Take 1 capsule (20 mg total) by mouth daily. 90 capsule 3   No current facility-administered medications on file prior to visit.    BP 120/78   Pulse 94   Temp (!) 97.5 F (36.4 C) (Oral)   Ht 5' 7.5" (1.715 m)   Wt 188 lb (85.3 kg)   LMP 11/15/2010 (Approximate)   SpO2 96%   BMI 29.01 kg/m      Objective:   Physical Exam Vitals and nursing note reviewed.  Constitutional:      General: She is not in acute distress.    Appearance: Normal appearance.  She is well-developed. She is not ill-appearing.  HENT:     Head: Normocephalic and atraumatic.     Right Ear: Tympanic membrane, ear canal and external ear normal. There is no impacted cerumen.     Left Ear: Tympanic membrane, ear canal and external ear normal. There is no impacted cerumen.     Nose: Nose normal. No congestion or rhinorrhea.     Mouth/Throat:     Mouth: Mucous membranes are moist.  Pharynx: Oropharynx is clear. No oropharyngeal exudate or posterior oropharyngeal erythema.  Eyes:     General:        Right eye: No discharge.        Left eye: No discharge.     Extraocular Movements: Extraocular movements intact.     Conjunctiva/sclera: Conjunctivae normal.     Pupils: Pupils are equal, round, and reactive to light.  Neck:     Thyroid: No thyromegaly.     Vascular: No carotid bruit.     Trachea: No tracheal deviation.  Cardiovascular:     Rate and Rhythm: Normal rate and regular rhythm.     Pulses: Normal pulses.     Heart sounds: Normal heart sounds. No murmur heard.    No friction rub. No gallop.  Pulmonary:     Effort: Pulmonary effort is normal. No respiratory distress.     Breath sounds: Normal breath sounds. No stridor. No wheezing, rhonchi or rales.  Chest:     Chest wall: No tenderness.  Abdominal:     General: Abdomen is flat. Bowel sounds are normal. There is no distension.     Palpations: Abdomen is soft. There is no mass.     Tenderness: There is no abdominal tenderness. There is no right CVA tenderness, left CVA tenderness, guarding or rebound.     Hernia: No hernia is present.  Musculoskeletal:        General: No swelling, tenderness, deformity or signs of injury. Normal range of motion.     Cervical back: Normal range of motion and neck supple.     Right lower leg: No edema.     Left lower leg: No edema.  Lymphadenopathy:     Cervical: No cervical adenopathy.  Skin:    General: Skin is warm and dry.     Coloration: Skin is not jaundiced or  pale.     Findings: No bruising, erythema, lesion or rash.  Neurological:     General: No focal deficit present.     Mental Status: She is alert and oriented to person, place, and time.     Cranial Nerves: No cranial nerve deficit.     Sensory: No sensory deficit.     Motor: No weakness.     Coordination: Coordination normal.     Gait: Gait normal.     Deep Tendon Reflexes: Reflexes normal.  Psychiatric:        Mood and Affect: Mood normal.        Behavior: Behavior normal.        Thought Content: Thought content normal.        Judgment: Judgment normal.        Assessment & Plan:  1. Routine general medical examination at a health care facility Today patient counseled on age appropriate routine health concerns for screening and prevention, each reviewed and up to date or declined. Immunizations reviewed and up to date or declined. Labs ordered and reviewed. Risk factors for depression reviewed and negative. Hearing function and visual acuity are intact. ADLs screened and addressed as needed. Functional ability and level of safety reviewed and appropriate. Education, counseling and referrals performed based on assessed risks today. Patient provided with a copy of personalized plan for preventive services. - Follow up in one year or sooner if needed - Follow up with GYN   2. Essential hypertension - well controlled.  - No change in medications  - CBC with Differential/Platelet; Future - Comprehensive metabolic panel; Future - Lipid  panel; Future - TSH; Future - Microalbumin/Creatinine Ratio, Urine; Future - Microalbumin/Creatinine Ratio, Urine - TSH - Lipid panel - Comprehensive metabolic panel - CBC with Differential/Platelet  3. Type 2 diabetes mellitus with diabetic neuropathy, without long-term current use of insulin ([place]) - Per endocrinology. She has made a lot of change in her life and I am proud of her.  - Encouraged to start working out - CBC with  Differential/Platelet; Future - Comprehensive metabolic panel; Future - Lipid panel; Future - TSH; Future - Microalbumin/Creatinine Ratio, Urine; Future - Microalbumin/Creatinine Ratio, Urine - TSH - Lipid panel - Comprehensive metabolic panel - CBC with Differential/Platelet  4. Mixed hyperlipidemia - Continue lipitor 80 mg daily  - CBC with Differential/Platelet; Future - Comprehensive metabolic panel; Future - Lipid panel; Future - TSH; Future - TSH - Lipid panel - Comprehensive metabolic panel - CBC with Differential/Platelet  5. Neuropathy - Continue Gabapentin  - CBC with Differential/Platelet; Future - Comprehensive metabolic panel; Future - Lipid panel; Future - TSH; Future - TSH - Lipid panel - Comprehensive metabolic panel - CBC with Differential/Platelet  6. Gastroesophageal reflux disease with esophagitis without hemorrhage - Continue PPI  - CBC with Differential/Platelet; Future - Comprehensive metabolic panel; Future - Lipid panel; Future - TSH; Future - TSH - Lipid panel - Comprehensive metabolic panel - CBC with Differential/Platelet  Dorothyann Peng, NP

## 2023-02-10 DIAGNOSIS — K5730 Diverticulosis of large intestine without perforation or abscess without bleeding: Secondary | ICD-10-CM | POA: Diagnosis not present

## 2023-02-10 DIAGNOSIS — Z980 Intestinal bypass and anastomosis status: Secondary | ICD-10-CM | POA: Diagnosis not present

## 2023-02-10 DIAGNOSIS — Z1211 Encounter for screening for malignant neoplasm of colon: Secondary | ICD-10-CM | POA: Diagnosis not present

## 2023-02-10 DIAGNOSIS — K562 Volvulus: Secondary | ICD-10-CM | POA: Diagnosis not present

## 2023-02-10 LAB — HM COLONOSCOPY

## 2023-02-14 ENCOUNTER — Telehealth: Payer: Self-pay

## 2023-02-14 NOTE — Patient Outreach (Signed)
  Care Coordination   02/14/2023 Name: Asheli Matyas MRN: [id number] DOB: 1961/07/29   Care Coordination Outreach Attempts:  An unsuccessful telephone outreach was attempted today to offer the patient information about available care coordination services as a benefit of their health plan.   Follow Up Plan:  Additional outreach attempts will be made to offer the patient care coordination information and services.   Encounter Outcome:  No Answer   Care Coordination Interventions:  No, not indicated     Jone Baseman, RN, MSN [facility] Care Management Coordinator Direct Line [phone]

## 2023-02-21 ENCOUNTER — Telehealth: Payer: Self-pay | Admitting: Adult Health

## 2023-02-21 NOTE — Telephone Encounter (Signed)
Pt called, returning CMA's call.   Pt informed that NP and CMA are OOO today. Please return her call at your convenience.

## 2023-02-22 NOTE — Telephone Encounter (Signed)
Called pt no answer °

## 2023-02-22 NOTE — Telephone Encounter (Signed)
Pt called, returning CMA's call. CMA was with a patient Pt asked that CMA call back at her earliest convenience. 

## 2023-02-23 NOTE — Telephone Encounter (Signed)
Patient notified of update  and verbalized understanding. 

## 2023-03-30 DIAGNOSIS — E785 Hyperlipidemia, unspecified: Secondary | ICD-10-CM | POA: Diagnosis not present

## 2023-03-30 DIAGNOSIS — E1165 Type 2 diabetes mellitus with hyperglycemia: Secondary | ICD-10-CM | POA: Diagnosis not present

## 2023-04-03 ENCOUNTER — Other Ambulatory Visit: Payer: Self-pay | Admitting: Adult Health

## 2023-04-06 DIAGNOSIS — E1165 Type 2 diabetes mellitus with hyperglycemia: Secondary | ICD-10-CM | POA: Diagnosis not present

## 2023-04-06 DIAGNOSIS — Z794 Long term (current) use of insulin: Secondary | ICD-10-CM | POA: Diagnosis not present

## 2023-04-06 DIAGNOSIS — Z7984 Long term (current) use of oral hypoglycemic drugs: Secondary | ICD-10-CM | POA: Diagnosis not present

## 2023-04-06 DIAGNOSIS — E11649 Type 2 diabetes mellitus with hypoglycemia without coma: Secondary | ICD-10-CM | POA: Diagnosis not present

## 2023-04-06 DIAGNOSIS — I10 Essential (primary) hypertension: Secondary | ICD-10-CM | POA: Diagnosis not present

## 2023-04-23 ENCOUNTER — Other Ambulatory Visit: Payer: Self-pay | Admitting: Adult Health

## 2023-04-23 DIAGNOSIS — I10 Essential (primary) hypertension: Secondary | ICD-10-CM

## 2023-05-03 DIAGNOSIS — E113493 Type 2 diabetes mellitus with severe nonproliferative diabetic retinopathy without macular edema, bilateral: Secondary | ICD-10-CM | POA: Diagnosis not present

## 2023-05-03 DIAGNOSIS — H1789 Other corneal scars and opacities: Secondary | ICD-10-CM | POA: Diagnosis not present

## 2023-05-03 DIAGNOSIS — H2513 Age-related nuclear cataract, bilateral: Secondary | ICD-10-CM | POA: Diagnosis not present

## 2023-05-03 DIAGNOSIS — H524 Presbyopia: Secondary | ICD-10-CM | POA: Diagnosis not present

## 2023-06-01 DIAGNOSIS — E113491 Type 2 diabetes mellitus with severe nonproliferative diabetic retinopathy without macular edema, right eye: Secondary | ICD-10-CM | POA: Diagnosis not present

## 2023-06-01 DIAGNOSIS — H2513 Age-related nuclear cataract, bilateral: Secondary | ICD-10-CM | POA: Diagnosis not present

## 2023-06-01 DIAGNOSIS — E113412 Type 2 diabetes mellitus with severe nonproliferative diabetic retinopathy with macular edema, left eye: Secondary | ICD-10-CM | POA: Diagnosis not present

## 2023-06-30 DIAGNOSIS — E785 Hyperlipidemia, unspecified: Secondary | ICD-10-CM | POA: Diagnosis not present

## 2023-06-30 DIAGNOSIS — E1165 Type 2 diabetes mellitus with hyperglycemia: Secondary | ICD-10-CM | POA: Diagnosis not present

## 2023-07-07 DIAGNOSIS — E785 Hyperlipidemia, unspecified: Secondary | ICD-10-CM | POA: Diagnosis not present

## 2023-07-07 DIAGNOSIS — E1140 Type 2 diabetes mellitus with diabetic neuropathy, unspecified: Secondary | ICD-10-CM | POA: Diagnosis not present

## 2023-07-07 DIAGNOSIS — I10 Essential (primary) hypertension: Secondary | ICD-10-CM | POA: Diagnosis not present

## 2023-07-07 DIAGNOSIS — E1165 Type 2 diabetes mellitus with hyperglycemia: Secondary | ICD-10-CM | POA: Diagnosis not present

## 2023-07-27 ENCOUNTER — Other Ambulatory Visit: Payer: Self-pay | Admitting: Adult Health

## 2023-07-27 DIAGNOSIS — I10 Essential (primary) hypertension: Secondary | ICD-10-CM

## 2023-08-10 DIAGNOSIS — L578 Other skin changes due to chronic exposure to nonionizing radiation: Secondary | ICD-10-CM | POA: Diagnosis not present

## 2023-08-10 DIAGNOSIS — D485 Neoplasm of uncertain behavior of skin: Secondary | ICD-10-CM | POA: Diagnosis not present

## 2023-08-10 DIAGNOSIS — L905 Scar conditions and fibrosis of skin: Secondary | ICD-10-CM | POA: Diagnosis not present

## 2023-08-10 DIAGNOSIS — L570 Actinic keratosis: Secondary | ICD-10-CM | POA: Diagnosis not present

## 2023-08-10 DIAGNOSIS — C44311 Basal cell carcinoma of skin of nose: Secondary | ICD-10-CM | POA: Diagnosis not present

## 2023-08-10 DIAGNOSIS — L816 Other disorders of diminished melanin formation: Secondary | ICD-10-CM | POA: Diagnosis not present

## 2023-08-10 DIAGNOSIS — L650 Telogen effluvium: Secondary | ICD-10-CM | POA: Diagnosis not present

## 2023-08-16 DIAGNOSIS — C4491 Basal cell carcinoma of skin, unspecified: Secondary | ICD-10-CM

## 2023-08-16 HISTORY — DX: Basal cell carcinoma of skin, unspecified: C44.91

## 2023-09-22 DIAGNOSIS — C44311 Basal cell carcinoma of skin of nose: Secondary | ICD-10-CM | POA: Diagnosis not present

## 2023-09-26 DIAGNOSIS — L578 Other skin changes due to chronic exposure to nonionizing radiation: Secondary | ICD-10-CM | POA: Diagnosis not present

## 2023-09-26 DIAGNOSIS — X32XXXS Exposure to sunlight, sequela: Secondary | ICD-10-CM | POA: Diagnosis not present

## 2023-09-26 DIAGNOSIS — X32XXXA Exposure to sunlight, initial encounter: Secondary | ICD-10-CM | POA: Diagnosis not present

## 2023-09-26 DIAGNOSIS — C44311 Basal cell carcinoma of skin of nose: Secondary | ICD-10-CM | POA: Diagnosis not present

## 2023-09-30 DIAGNOSIS — E1165 Type 2 diabetes mellitus with hyperglycemia: Secondary | ICD-10-CM | POA: Diagnosis not present

## 2023-10-04 ENCOUNTER — Other Ambulatory Visit: Payer: Self-pay | Admitting: Adult Health

## 2023-10-04 DIAGNOSIS — E1140 Type 2 diabetes mellitus with diabetic neuropathy, unspecified: Secondary | ICD-10-CM

## 2023-10-05 DIAGNOSIS — E113491 Type 2 diabetes mellitus with severe nonproliferative diabetic retinopathy without macular edema, right eye: Secondary | ICD-10-CM | POA: Diagnosis not present

## 2023-10-05 DIAGNOSIS — H2513 Age-related nuclear cataract, bilateral: Secondary | ICD-10-CM | POA: Diagnosis not present

## 2023-10-05 DIAGNOSIS — E113412 Type 2 diabetes mellitus with severe nonproliferative diabetic retinopathy with macular edema, left eye: Secondary | ICD-10-CM | POA: Diagnosis not present

## 2023-10-06 DIAGNOSIS — E1165 Type 2 diabetes mellitus with hyperglycemia: Secondary | ICD-10-CM | POA: Diagnosis not present

## 2023-10-06 DIAGNOSIS — E785 Hyperlipidemia, unspecified: Secondary | ICD-10-CM | POA: Diagnosis not present

## 2023-10-06 DIAGNOSIS — E1140 Type 2 diabetes mellitus with diabetic neuropathy, unspecified: Secondary | ICD-10-CM | POA: Diagnosis not present

## 2023-10-06 DIAGNOSIS — I10 Essential (primary) hypertension: Secondary | ICD-10-CM | POA: Diagnosis not present

## 2023-10-25 ENCOUNTER — Other Ambulatory Visit: Payer: Self-pay | Admitting: Adult Health

## 2023-10-25 DIAGNOSIS — I10 Essential (primary) hypertension: Secondary | ICD-10-CM

## 2023-10-26 DIAGNOSIS — L570 Actinic keratosis: Secondary | ICD-10-CM | POA: Diagnosis not present

## 2023-11-14 ENCOUNTER — Telehealth: Payer: Self-pay

## 2023-11-14 DIAGNOSIS — I10 Essential (primary) hypertension: Secondary | ICD-10-CM

## 2023-11-14 NOTE — Telephone Encounter (Signed)
Copied from CRM [id number]. Topic: Clinical - Prescription Issue >> Nov 11, 2023 11:08 AM Truddie Crumble wrote: Reason for CRM: Pt called stating the pharmacy reached out to the office in regards to her medication-amlodipine. Pt stated this medication should have been good for a year but she does not have any refills left.

## 2023-11-15 MED ORDER — AMLODIPINE BESYLATE 10 MG PO TABS: 10.0000 mg | ORAL_TABLET | Freq: Every day | ORAL | 0 refills | Status: DC

## 2023-11-15 NOTE — Telephone Encounter (Signed)
Rx refilled for 90 days 

## 2023-11-15 NOTE — Addendum Note (Signed)
Addended by: Waymon Amato R on: 11/15/2023 12:46 PM   Modules accepted: Orders

## 2023-12-21 DIAGNOSIS — Z85828 Personal history of other malignant neoplasm of skin: Secondary | ICD-10-CM | POA: Diagnosis not present

## 2023-12-21 DIAGNOSIS — C44311 Basal cell carcinoma of skin of nose: Secondary | ICD-10-CM | POA: Diagnosis not present

## 2023-12-23 DIAGNOSIS — Z1231 Encounter for screening mammogram for malignant neoplasm of breast: Secondary | ICD-10-CM | POA: Diagnosis not present

## 2023-12-23 LAB — HM MAMMOGRAPHY

## 2024-02-12 ENCOUNTER — Other Ambulatory Visit: Payer: Self-pay | Admitting: Adult Health

## 2024-02-12 DIAGNOSIS — I10 Essential (primary) hypertension: Secondary | ICD-10-CM

## 2024-02-15 ENCOUNTER — Encounter: Payer: BC Managed Care – PPO | Admitting: Adult Health

## 2024-02-20 DIAGNOSIS — C44311 Basal cell carcinoma of skin of nose: Secondary | ICD-10-CM | POA: Diagnosis not present

## 2024-02-29 DIAGNOSIS — E1165 Type 2 diabetes mellitus with hyperglycemia: Secondary | ICD-10-CM | POA: Diagnosis not present

## 2024-03-07 DIAGNOSIS — E785 Hyperlipidemia, unspecified: Secondary | ICD-10-CM | POA: Diagnosis not present

## 2024-03-07 DIAGNOSIS — E1165 Type 2 diabetes mellitus with hyperglycemia: Secondary | ICD-10-CM | POA: Diagnosis not present

## 2024-03-07 DIAGNOSIS — E1140 Type 2 diabetes mellitus with diabetic neuropathy, unspecified: Secondary | ICD-10-CM | POA: Diagnosis not present

## 2024-03-07 DIAGNOSIS — I10 Essential (primary) hypertension: Secondary | ICD-10-CM | POA: Diagnosis not present

## 2024-03-21 DIAGNOSIS — C44311 Basal cell carcinoma of skin of nose: Secondary | ICD-10-CM | POA: Diagnosis not present

## 2024-03-22 DIAGNOSIS — C44311 Basal cell carcinoma of skin of nose: Secondary | ICD-10-CM | POA: Diagnosis not present

## 2024-03-23 DIAGNOSIS — C44311 Basal cell carcinoma of skin of nose: Secondary | ICD-10-CM | POA: Diagnosis not present

## 2024-03-26 DIAGNOSIS — C44311 Basal cell carcinoma of skin of nose: Secondary | ICD-10-CM | POA: Diagnosis not present

## 2024-03-27 DIAGNOSIS — C44311 Basal cell carcinoma of skin of nose: Secondary | ICD-10-CM | POA: Diagnosis not present

## 2024-03-29 DIAGNOSIS — C44311 Basal cell carcinoma of skin of nose: Secondary | ICD-10-CM | POA: Diagnosis not present

## 2024-03-31 DIAGNOSIS — C44311 Basal cell carcinoma of skin of nose: Secondary | ICD-10-CM | POA: Diagnosis not present

## 2024-04-02 DIAGNOSIS — C44311 Basal cell carcinoma of skin of nose: Secondary | ICD-10-CM | POA: Diagnosis not present

## 2024-04-03 DIAGNOSIS — C44311 Basal cell carcinoma of skin of nose: Secondary | ICD-10-CM | POA: Diagnosis not present

## 2024-04-04 ENCOUNTER — Other Ambulatory Visit: Payer: Self-pay | Admitting: Adult Health

## 2024-04-05 DIAGNOSIS — C44311 Basal cell carcinoma of skin of nose: Secondary | ICD-10-CM | POA: Diagnosis not present

## 2024-04-10 DIAGNOSIS — C44311 Basal cell carcinoma of skin of nose: Secondary | ICD-10-CM | POA: Diagnosis not present

## 2024-04-11 DIAGNOSIS — C44311 Basal cell carcinoma of skin of nose: Secondary | ICD-10-CM | POA: Diagnosis not present

## 2024-04-12 DIAGNOSIS — C44311 Basal cell carcinoma of skin of nose: Secondary | ICD-10-CM | POA: Diagnosis not present

## 2024-04-14 DIAGNOSIS — C44311 Basal cell carcinoma of skin of nose: Secondary | ICD-10-CM | POA: Diagnosis not present

## 2024-04-16 DIAGNOSIS — C44311 Basal cell carcinoma of skin of nose: Secondary | ICD-10-CM | POA: Diagnosis not present

## 2024-04-17 DIAGNOSIS — C44311 Basal cell carcinoma of skin of nose: Secondary | ICD-10-CM | POA: Diagnosis not present

## 2024-04-18 ENCOUNTER — Encounter: Payer: Self-pay | Admitting: Adult Health

## 2024-04-18 ENCOUNTER — Ambulatory Visit (INDEPENDENT_AMBULATORY_CARE_PROVIDER_SITE_OTHER): Admitting: Adult Health

## 2024-04-18 VITALS — BP 110/70 | HR 97 | Temp 98.1°F | Ht 67.5 in | Wt 190.0 lb

## 2024-04-18 DIAGNOSIS — Z0000 Encounter for general adult medical examination without abnormal findings: Secondary | ICD-10-CM

## 2024-04-18 DIAGNOSIS — Z7984 Long term (current) use of oral hypoglycemic drugs: Secondary | ICD-10-CM

## 2024-04-18 DIAGNOSIS — Z85828 Personal history of other malignant neoplasm of skin: Secondary | ICD-10-CM

## 2024-04-18 DIAGNOSIS — K2100 Gastro-esophageal reflux disease with esophagitis, without bleeding: Secondary | ICD-10-CM

## 2024-04-18 DIAGNOSIS — Z794 Long term (current) use of insulin: Secondary | ICD-10-CM

## 2024-04-18 DIAGNOSIS — E782 Mixed hyperlipidemia: Secondary | ICD-10-CM

## 2024-04-18 DIAGNOSIS — E119 Type 2 diabetes mellitus without complications: Secondary | ICD-10-CM | POA: Diagnosis not present

## 2024-04-18 DIAGNOSIS — Z7985 Long-term (current) use of injectable non-insulin antidiabetic drugs: Secondary | ICD-10-CM | POA: Diagnosis not present

## 2024-04-18 DIAGNOSIS — I10 Essential (primary) hypertension: Secondary | ICD-10-CM | POA: Diagnosis not present

## 2024-04-18 LAB — CBC WITH DIFFERENTIAL/PLATELET
Basophils Absolute: 0.1 10*3/uL (ref 0.0–0.1)
Basophils Relative: 0.5 % (ref 0.0–3.0)
Eosinophils Absolute: 0.1 10*3/uL (ref 0.0–0.7)
Eosinophils Relative: 1.2 % (ref 0.0–5.0)
HCT: 41.7 % (ref 36.0–46.0)
Hemoglobin: 13.9 g/dL (ref 12.0–15.0)
Lymphocytes Relative: 26.3 % (ref 12.0–46.0)
Lymphs Abs: 2.6 10*3/uL (ref 0.7–4.0)
MCHC: 33.3 g/dL (ref 30.0–36.0)
MCV: 79.3 fl (ref 78.0–100.0)
Monocytes Absolute: 0.5 10*3/uL (ref 0.1–1.0)
Monocytes Relative: 5.5 % (ref 3.0–12.0)
Neutro Abs: 6.6 10*3/uL (ref 1.4–7.7)
Neutrophils Relative %: 66.5 % (ref 43.0–77.0)
Platelets: 353 10*3/uL (ref 150.0–400.0)
RBC: 5.26 Mil/uL — ABNORMAL HIGH (ref 3.87–5.11)
RDW: 14.6 % (ref 11.5–15.5)
WBC: 10 10*3/uL (ref 4.0–10.5)

## 2024-04-18 LAB — LIPID PANEL
Cholesterol: 114 mg/dL (ref 0–200)
HDL: 49.6 mg/dL (ref >39.00)
LDL Cholesterol: 43 mg/dL (ref 0–99)
NonHDL: 64.87
Total CHOL/HDL Ratio: 2
Triglycerides: 110 mg/dL (ref 0.0–149.0)
VLDL: 22 mg/dL (ref 0.0–40.0)

## 2024-04-18 LAB — COMPREHENSIVE METABOLIC PANEL WITH GFR
ALT: 11 U/L (ref 0–35)
AST: 13 U/L (ref 0–37)
Albumin: 4.2 g/dL (ref 3.5–5.2)
Alkaline Phosphatase: 62 U/L (ref 39–117)
BUN: 22 mg/dL (ref 6–23)
CO2: 26 meq/L (ref 19–32)
Calcium: 9.2 mg/dL (ref 8.4–10.5)
Chloride: 103 meq/L (ref 96–112)
Creatinine, Ser: 0.64 mg/dL (ref 0.40–1.20)
GFR: 94.08 mL/min (ref >60.00)
Glucose, Bld: 106 mg/dL — ABNORMAL HIGH (ref 70–99)
Potassium: 4.1 meq/L (ref 3.5–5.1)
Sodium: 138 meq/L (ref 135–145)
Total Bilirubin: 0.7 mg/dL (ref 0.2–1.2)
Total Protein: 7 g/dL (ref 6.0–8.3)

## 2024-04-18 LAB — TSH: TSH: 0.86 u[IU]/mL (ref 0.35–5.50)

## 2024-04-18 MED ORDER — ATORVASTATIN CALCIUM 80 MG PO TABS: 80.0000 mg | ORAL_TABLET | Freq: Every day | ORAL | 3 refills | Status: DC

## 2024-04-18 MED ORDER — AMLODIPINE BESYLATE 10 MG PO TABS
10.0000 mg | ORAL_TABLET | Freq: Every day | ORAL | 3 refills | Status: AC
Start: 2024-04-18 — End: ?

## 2024-04-18 NOTE — Progress Notes (Signed)
 Subjective:    Patient ID: Breanna Hall, female    DOB: 13-Jun-1961, 63 y.o.   MRN: [id number]  HPI Patient presents for yearly preventative medicine examination. She is a pleasant 63 year old female who  has a past medical history of Basal cell carcinoma (08/16/2023), Bowel obstruction (HCC), Colon polyp, Diabetes mellitus, Endometriosis, Frozen shoulder, and Hypertension.  DM Type 2 - she isnow being seen by Endocrinology in [place] , She is currently managed with Metformin  500 mg ER twice daily, Glipizide  10 mg ER daily, Jardiance  25 mg daily, Tresiba  15 units daily and Ozempic 2.0 mg weekly. She has not been drinking soda or potato chips and is trying to eat healthier. She has not started exercising yet.    Her last A1c was 8.3 in March 2025.    CONTINUOUS GLUCOSE MONITORING RECORD INTERPRETATION                 Dates of Recording: Mar 7th 2025 - June 3rd 2025   Sensor description: freestyle libre    Results statistics:      Average  167  Time in range 65%  % Time Above 180 25%  % Time above 250 10%  % Time Below target 0%     Hypertension-managed with Norvasc  10 mg daily lisinopril  40 mg daily.  She denies dizziness, lightheadedness, chest pain, or shortness of breath  BP Readings from Last 3 Encounters:  04/18/24 110/70  02/09/23 120/78  10/28/22 110/80   Hyperlipidemia - managed with lipitor 80 mg. Denies myalgia or fatigue  Lab Results  Component Value Date   CHOL 144 02/09/2023   HDL 71.60 02/09/2023   LDLCALC 56 02/09/2023   LDLDIRECT 100.0 02/12/2021   TRIG 79.0 02/09/2023   CHOLHDL 2 02/09/2023   History of skin cancer - she is seen by [facility] twice a year. Currently undergoing radiation therapy for skin cancer on her nose.   All immunizations and health maintenance protocols were reviewed with the patient and needed orders were placed. She will get her vaccinations at her local pharmacy.   Appropriate screening  laboratory values were ordered for the patient including screening of hyperlipidemia, renal function and hepatic function.  Medication reconciliation,  past medical history, social history, problem list and allergies were reviewed in detail with the patient  Goals were established with regard to weight loss, exercise, and  diet in compliance with medications Wt Readings from Last 3 Encounters:  04/18/24 190 lb (86.2 kg)  02/09/23 188 lb (85.3 kg)  10/28/22 183 lb (83 kg)    Review of Systems  Constitutional: Negative.   HENT: Negative.    Eyes: Negative.   Respiratory: Negative.    Cardiovascular: Negative.   Gastrointestinal: Negative.   Endocrine: Negative.   Genitourinary: Negative.   Musculoskeletal: Negative.   Skin: Negative.   Allergic/Immunologic: Negative.   Neurological: Negative.   Hematological: Negative.   Psychiatric/Behavioral: Negative.     Past Medical History:  Diagnosis Date   Basal cell carcinoma 08/16/2023   Bowel obstruction (HCC)    Colon polyp    Diabetes mellitus    Endometriosis    Frozen shoulder    Hypertension    high blood pressure readings    Social History   Socioeconomic History   Marital status: Single    Spouse name: Not on file   Number of children: Not on file   Years of education: Not on file   Highest education level: Not  on file  Occupational History   Not on file  Tobacco Use   Smoking status: Never   Smokeless tobacco: Never  Substance and Sexual Activity   Alcohol use: No   Drug use: No   Sexual activity: Not on file  Other Topics Concern   Not on file  Social History Narrative   Works in a call center answering phose   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   Social Drivers of Health   Financial Resource Strain: Not on file  Food Insecurity: Not on file  Transportation Needs: Not on file  Physical Activity: Not on file  Stress: Not on file  Social Connections: Unknown (03/24/2022)   [facility]    Social Network    Social Network: Not on file  Intimate Partner Violence: Unknown (02/17/2022)   Received from [facility], [facility]   HITS    Physically Hurt: Not on file    Insult or Talk Down To: Not on file    Threaten Physical Harm: Not on file    Scream or Curse: Not on file    Past Surgical History:  Procedure Laterality Date   CLAVICLE EXCISION  2012   COLECTOMY  Feb 2010   sigmoid   MOHS SURGERY Left 11/28/2018   Basal Cell Carcinoma- infiltrating/Left Dorsum of nose   TONSILLECTOMY AND ADENOIDECTOMY  1989   VENTRAL HERNIA REPAIR  09-30-09    Family History  Problem Relation Age of Onset   Breast cancer Mother 9       Smoker   Elevated Lipids Mother    Hypertension Mother    Diabetes Mother    Elevated Lipids Father    Colon cancer Maternal Grandmother    Lung cancer Maternal Grandmother    Lung cancer Maternal Grandfather        Smoker   Brain cancer Maternal Grandfather    Uterine cancer Paternal Grandmother    Diabetes Paternal Grandfather     Allergies  Allergen Reactions   Penicillins    Sulfa Antibiotics Other (See Comments)    Other reaction(s): Unknown    Current Outpatient Medications on File Prior to Visit  Medication Sig Dispense Refill   Continuous Blood Gluc Sensor (FREESTYLE LIBRE 2 SENSOR) MISC Use libre app 6 each 3   glipiZIDE  (GLUCOTROL  XL) 10 MG 24 hr tablet Take 10 mg by mouth daily with breakfast.     Insulin  Degludec (TRESIBA ) 100 UNIT/ML SOLN Inject 15 Units into the skin daily.     Insulin  Pen Needle (PEN NEEDLES) 32G X 4 MM MISC Use with insulin  pen 100 each 3   JARDIANCE  25 MG TABS tablet TAKE 1 TABLET BY MOUTH EVERY DAY BEFORE BREAKFAST 90 tablet 0   lisinopril  (ZESTRIL ) 40 MG tablet TAKE 1 TABLET BY MOUTH EVERY DAY 90 tablet 3   metformin  (FORTAMET ) 500 MG (OSM) 24 hr tablet Take 500 mg by mouth 2 (two) times daily.     Semaglutide,0.25 or 0.5MG /DOS, (OZEMPIC, 0.25 OR 0.5 MG/DOSE,) 2 MG/1.5ML SOPN Inject 0.25 mg into  the skin once a week.     gabapentin  (NEURONTIN ) 300 MG capsule TAKE 1 CAPSULE (300 MG TOTAL) BY MOUTH 2 (TWO) TIMES DAILY. (Patient not taking: Reported on 04/18/2024) 90 capsule 1   No current facility-administered medications on file prior to visit.    BP 110/70   Pulse 97   Temp 98.1 F (36.7 C) (Oral)   Ht 5' 7.5" (1.715 m)   Wt 190  lb (86.2 kg)   LMP 11/15/2010 (Approximate)   SpO2 99%   BMI 29.32 kg/m       Objective:   Physical Exam Vitals and nursing note reviewed.  Constitutional:      General: She is not in acute distress.    Appearance: Normal appearance. She is not ill-appearing.  HENT:     Head: Normocephalic and atraumatic.     Right Ear: Tympanic membrane, ear canal and external ear normal. There is no impacted cerumen.     Left Ear: Tympanic membrane, ear canal and external ear normal. There is no impacted cerumen.     Nose: Nose normal. No congestion or rhinorrhea.     Mouth/Throat:     Mouth: Mucous membranes are moist.     Pharynx: Oropharynx is clear.  Eyes:     Extraocular Movements: Extraocular movements intact.     Conjunctiva/sclera: Conjunctivae normal.     Pupils: Pupils are equal, round, and reactive to light.  Neck:     Vascular: No carotid bruit.  Cardiovascular:     Rate and Rhythm: Normal rate and regular rhythm.     Pulses: Normal pulses.     Heart sounds: No murmur heard.    No friction rub. No gallop.  Pulmonary:     Effort: Pulmonary effort is normal.     Breath sounds: Normal breath sounds.  Abdominal:     General: Abdomen is flat. Bowel sounds are normal. There is no distension.     Palpations: Abdomen is soft. There is no mass.     Tenderness: There is no abdominal tenderness. There is no guarding or rebound.     Hernia: No hernia is present.  Musculoskeletal:        General: Normal range of motion.     Cervical back: Normal range of motion and neck supple.  Lymphadenopathy:     Cervical: No cervical adenopathy.  Skin:     General: Skin is warm and dry.     Capillary Refill: Capillary refill takes less than 2 seconds.     Findings: Lesion present.  Neurological:     General: No focal deficit present.     Mental Status: She is alert and oriented to person, place, and time.  Psychiatric:        Mood and Affect: Mood normal.        Behavior: Behavior normal.        Thought Content: Thought content normal.        Judgment: Judgment normal.        Assessment & Plan:  1. Routine general medical examination at a health care facility (Primary) Today patient counseled on age appropriate routine health concerns for screening and prevention, each reviewed and up to date or declined. Immunizations reviewed and up to date or declined. Labs ordered and reviewed. Risk factors for depression reviewed and negative. Hearing function and visual acuity are intact. ADLs screened and addressed as needed. Functional ability and level of safety reviewed and appropriate. Education, counseling and referrals performed based on assessed risks today. Patient provided with a copy of personalized plan for preventive services. - Encouraged to get routine vaccinations  - Follow up in one year or sooner if needed  2. Diabetes mellitus treated with insulin  and oral medication (HCC) - Per endocrinology  - needs to start exercising.  - CBC with Differential/Platelet; Future - Comprehensive metabolic panel with GFR; Future - Lipid panel; Future - TSH; Future  3. Long-term current  use of injectable noninsulin antidiabetic medication - per endocrinology  - CBC with Differential/Platelet; Future - Comprehensive metabolic panel with GFR; Future - Lipid panel; Future - TSH; Future  4. Essential hypertension - Well controlled. No change in medication  - CBC with Differential/Platelet; Future - Comprehensive metabolic panel with GFR; Future - Lipid panel; Future - TSH; Future - amLODipine  (NORVASC ) 10 MG tablet; Take 1 tablet (10 mg  total) by mouth daily.  Dispense: 90 tablet; Refill: 3  5. Mixed hyperlipidemia - Continue with Statin  - CBC with Differential/Platelet; Future - Comprehensive metabolic panel with GFR; Future - Lipid panel; Future - TSH; Future - atorvastatin  (LIPITOR) 80 MG tablet; Take 1 tablet (80 mg total) by mouth daily.  Dispense: 90 tablet; Refill: 3   6. History of skin cancer - Per dermatology

## 2024-04-19 ENCOUNTER — Ambulatory Visit: Payer: Self-pay | Admitting: Adult Health

## 2024-04-19 DIAGNOSIS — C44311 Basal cell carcinoma of skin of nose: Secondary | ICD-10-CM | POA: Diagnosis not present

## 2024-04-23 DIAGNOSIS — C44311 Basal cell carcinoma of skin of nose: Secondary | ICD-10-CM | POA: Diagnosis not present

## 2024-04-24 DIAGNOSIS — C44311 Basal cell carcinoma of skin of nose: Secondary | ICD-10-CM | POA: Diagnosis not present

## 2024-04-26 DIAGNOSIS — C44311 Basal cell carcinoma of skin of nose: Secondary | ICD-10-CM | POA: Diagnosis not present

## 2024-04-28 DIAGNOSIS — C44311 Basal cell carcinoma of skin of nose: Secondary | ICD-10-CM | POA: Diagnosis not present

## 2024-04-30 DIAGNOSIS — C44311 Basal cell carcinoma of skin of nose: Secondary | ICD-10-CM | POA: Diagnosis not present

## 2024-05-01 DIAGNOSIS — C44311 Basal cell carcinoma of skin of nose: Secondary | ICD-10-CM | POA: Diagnosis not present

## 2024-05-03 ENCOUNTER — Other Ambulatory Visit: Payer: Self-pay | Admitting: Adult Health

## 2024-05-03 DIAGNOSIS — E782 Mixed hyperlipidemia: Secondary | ICD-10-CM

## 2024-05-03 DIAGNOSIS — C44311 Basal cell carcinoma of skin of nose: Secondary | ICD-10-CM | POA: Diagnosis not present

## 2024-05-07 DIAGNOSIS — C44311 Basal cell carcinoma of skin of nose: Secondary | ICD-10-CM | POA: Diagnosis not present

## 2024-05-08 DIAGNOSIS — C44311 Basal cell carcinoma of skin of nose: Secondary | ICD-10-CM | POA: Diagnosis not present

## 2024-05-12 DIAGNOSIS — C44311 Basal cell carcinoma of skin of nose: Secondary | ICD-10-CM | POA: Diagnosis not present

## 2024-05-31 DIAGNOSIS — E1165 Type 2 diabetes mellitus with hyperglycemia: Secondary | ICD-10-CM | POA: Diagnosis not present

## 2024-06-13 DIAGNOSIS — I10 Essential (primary) hypertension: Secondary | ICD-10-CM | POA: Diagnosis not present

## 2024-06-13 DIAGNOSIS — E1165 Type 2 diabetes mellitus with hyperglycemia: Secondary | ICD-10-CM | POA: Diagnosis not present

## 2024-06-13 DIAGNOSIS — Z794 Long term (current) use of insulin: Secondary | ICD-10-CM | POA: Diagnosis not present

## 2024-06-13 DIAGNOSIS — Z6829 Body mass index (BMI) 29.0-29.9, adult: Secondary | ICD-10-CM | POA: Diagnosis not present

## 2024-06-13 DIAGNOSIS — E1142 Type 2 diabetes mellitus with diabetic polyneuropathy: Secondary | ICD-10-CM | POA: Diagnosis not present

## 2024-07-02 DIAGNOSIS — C44311 Basal cell carcinoma of skin of nose: Secondary | ICD-10-CM | POA: Diagnosis not present

## 2024-07-27 DIAGNOSIS — H2513 Age-related nuclear cataract, bilateral: Secondary | ICD-10-CM | POA: Diagnosis not present

## 2024-07-27 DIAGNOSIS — E113512 Type 2 diabetes mellitus with proliferative diabetic retinopathy with macular edema, left eye: Secondary | ICD-10-CM | POA: Diagnosis not present

## 2024-07-27 DIAGNOSIS — H4312 Vitreous hemorrhage, left eye: Secondary | ICD-10-CM | POA: Diagnosis not present

## 2024-08-28 DIAGNOSIS — M7751 Other enthesopathy of right foot: Secondary | ICD-10-CM | POA: Diagnosis not present

## 2024-08-29 DIAGNOSIS — E113512 Type 2 diabetes mellitus with proliferative diabetic retinopathy with macular edema, left eye: Secondary | ICD-10-CM | POA: Diagnosis not present

## 2024-09-05 DIAGNOSIS — E1165 Type 2 diabetes mellitus with hyperglycemia: Secondary | ICD-10-CM | POA: Diagnosis not present

## 2024-09-13 DIAGNOSIS — E785 Hyperlipidemia, unspecified: Secondary | ICD-10-CM | POA: Diagnosis not present

## 2024-09-13 DIAGNOSIS — E113499 Type 2 diabetes mellitus with severe nonproliferative diabetic retinopathy without macular edema, unspecified eye: Secondary | ICD-10-CM | POA: Diagnosis not present

## 2024-09-13 DIAGNOSIS — E1142 Type 2 diabetes mellitus with diabetic polyneuropathy: Secondary | ICD-10-CM | POA: Diagnosis not present

## 2024-09-13 DIAGNOSIS — E1165 Type 2 diabetes mellitus with hyperglycemia: Secondary | ICD-10-CM | POA: Diagnosis not present

## 2024-09-13 DIAGNOSIS — Z6830 Body mass index (BMI) 30.0-30.9, adult: Secondary | ICD-10-CM | POA: Diagnosis not present

## 2024-09-13 DIAGNOSIS — Z794 Long term (current) use of insulin: Secondary | ICD-10-CM | POA: Diagnosis not present

## 2024-09-13 DIAGNOSIS — I10 Essential (primary) hypertension: Secondary | ICD-10-CM | POA: Diagnosis not present

## 2024-09-17 DIAGNOSIS — L304 Erythema intertrigo: Secondary | ICD-10-CM | POA: Diagnosis not present

## 2024-09-17 DIAGNOSIS — L700 Acne vulgaris: Secondary | ICD-10-CM | POA: Diagnosis not present

## 2024-09-17 DIAGNOSIS — L570 Actinic keratosis: Secondary | ICD-10-CM | POA: Diagnosis not present

## 2024-09-17 DIAGNOSIS — L905 Scar conditions and fibrosis of skin: Secondary | ICD-10-CM | POA: Diagnosis not present

## 2024-09-17 DIAGNOSIS — L578 Other skin changes due to chronic exposure to nonionizing radiation: Secondary | ICD-10-CM | POA: Diagnosis not present

## 2024-09-26 DIAGNOSIS — E113512 Type 2 diabetes mellitus with proliferative diabetic retinopathy with macular edema, left eye: Secondary | ICD-10-CM | POA: Diagnosis not present

## 2024-10-24 DIAGNOSIS — E113512 Type 2 diabetes mellitus with proliferative diabetic retinopathy with macular edema, left eye: Secondary | ICD-10-CM | POA: Diagnosis not present

## 2024-10-29 ENCOUNTER — Other Ambulatory Visit: Payer: Self-pay | Admitting: Adult Health

## 2024-10-29 DIAGNOSIS — I10 Essential (primary) hypertension: Secondary | ICD-10-CM
